# Patient Record
Sex: Female | Born: 1990
Health system: Southern US, Community
[De-identification: ages and names within clinical notes are randomized; demographics above are authoritative.]

## PROBLEM LIST (undated history)

## (undated) DIAGNOSIS — F419 Anxiety disorder, unspecified: Secondary | ICD-10-CM

## (undated) DIAGNOSIS — K219 Gastro-esophageal reflux disease without esophagitis: Secondary | ICD-10-CM

## (undated) HISTORY — PX: EYE SURGERY: SHX253

## (undated) HISTORY — PX: GALLBLADDER SURGERY: SHX652

## (undated) HISTORY — PX: NO PAST SURGERIES: SHX2092

## (undated) HISTORY — DX: Anxiety disorder, unspecified: F41.9

---

## 2014-07-04 ENCOUNTER — Inpatient Hospital Stay (HOSPITAL_COMMUNITY)
Admission: AD | Admit: 2014-07-04 | Discharge: 2014-07-04 | Disposition: A | Discharge status: Home / Self Care | Payer: Self-pay | Attending: Obstetrics and Gynecology | Admitting: Obstetrics and Gynecology

## 2015-06-19 ENCOUNTER — Other Ambulatory Visit: Payer: Self-pay | Admitting: Family Medicine

## 2015-06-19 DIAGNOSIS — N63 Unspecified lump in unspecified breast: Secondary | ICD-10-CM

## 2015-06-20 ENCOUNTER — Ambulatory Visit
Admission: RE | Admit: 2015-06-20 | Discharge: 2015-06-20 | Disposition: A | Discharge status: Home / Self Care | Payer: No Typology Code available for payment source | Source: Ambulatory Visit | Attending: Family Medicine | Admitting: Family Medicine

## 2015-06-20 ENCOUNTER — Ambulatory Visit
Admission: RE | Admit: 2015-06-20 | Discharge: 2015-06-20 | Disposition: A | Discharge status: Home / Self Care | Payer: Self-pay | Source: Ambulatory Visit | Attending: Family Medicine | Admitting: Family Medicine

## 2015-06-20 DIAGNOSIS — N63 Unspecified lump in unspecified breast: Secondary | ICD-10-CM

## 2015-08-02 ENCOUNTER — Encounter (HOSPITAL_COMMUNITY): Payer: Self-pay | Admitting: Emergency Medicine

## 2015-08-02 ENCOUNTER — Ambulatory Visit (HOSPITAL_COMMUNITY)
Admission: EM | Admit: 2015-08-02 | Discharge: 2015-08-02 | Disposition: A | Discharge status: Home / Self Care | Payer: No Typology Code available for payment source | Attending: Emergency Medicine | Admitting: Emergency Medicine

## 2015-08-02 DIAGNOSIS — R531 Weakness: Secondary | ICD-10-CM

## 2015-08-02 DIAGNOSIS — I951 Orthostatic hypotension: Secondary | ICD-10-CM

## 2015-08-02 DIAGNOSIS — R42 Dizziness and giddiness: Secondary | ICD-10-CM

## 2015-08-02 LAB — POCT I-STAT, CHEM 8
BUN: 6 mg/dL (ref 6–20)
CREATININE: 0.5 mg/dL (ref 0.44–1.00)
Calcium, Ion: 1.21 mmol/L (ref 1.12–1.23)
Chloride: 105 mmol/L (ref 101–111)
Glucose, Bld: 90 mg/dL (ref 65–99)
HEMATOCRIT: 44 % (ref 36.0–46.0)
HEMOGLOBIN: 15 g/dL (ref 12.0–15.0)
POTASSIUM: 4 mmol/L (ref 3.5–5.1)
SODIUM: 140 mmol/L (ref 135–145)
TCO2: 25 mmol/L (ref 0–100)

## 2015-08-02 NOTE — Discharge Instructions (Signed)
Your lab work and physical exam is normal. The blood pressure did drop when you stood up. This may represent mild dehydration. Drink plenty of fluids and stay well-hydrated daily.  Dizziness Dizziness is a common problem. It is a feeling of unsteadiness or light-headedness. You may feel like you are about to faint. Dizziness can lead to injury if you stumble or fall. Anyone can become dizzy, but dizziness is more common in older adults. This condition can be caused by a number of things, including medicines, dehydration, or illness. HOME CARE INSTRUCTIONS Taking these steps may help with your condition: Eating and Drinking  Drink enough fluid to keep your urine clear or pale yellow. This helps to keep you from becoming dehydrated. Try to drink more clear fluids, such as water.  Do not drink alcohol.  Limit your caffeine intake if directed by your health care provider.  Limit your salt intake if directed by your health care provider. Activity  Avoid making quick movements.  Rise slowly from chairs and steady yourself until you feel okay.  In the morning, first sit up on the side of the bed. When you feel okay, stand slowly while you hold onto something until you know that your balance is fine.  Move your legs often if you need to stand in one place for a long time. Tighten and relax your muscles in your legs while you are standing.  Do not drive or operate heavy machinery if you feel dizzy.  Avoid bending down if you feel dizzy. Place items in your home so that they are easy for you to reach without leaning over. Lifestyle  Do not use any tobacco products, including cigarettes, chewing tobacco, or electronic cigarettes. If you need help quitting, ask your health care provider.  Try to reduce your stress level, such as with yoga or meditation. Talk with your health care provider if you need help. General Instructions  Watch your dizziness for any changes.  Take medicines only as  directed by your health care provider. Talk with your health care provider if you think that your dizziness is caused by a medicine that you are taking.  Tell a friend or a family member that you are feeling dizzy. If he or she notices any changes in your behavior, have this person call your health care provider.  Keep all follow-up visits as directed by your health care provider. This is important. SEEK MEDICAL CARE IF:  Your dizziness does not go away.  Your dizziness or light-headedness gets worse.  You feel nauseous.  You have reduced hearing.  You have new symptoms.  You are unsteady on your feet or you feel like the room is spinning. SEEK IMMEDIATE MEDICAL CARE IF:  You vomit or have diarrhea and are unable to eat or drink anything.  You have problems talking, walking, swallowing, or using your arms, hands, or legs.  You feel generally weak.  You are not thinking clearly or you have trouble forming sentences. It may take a friend or family member to notice this.  You have chest pain, abdominal pain, shortness of breath, or sweating.  Your vision changes.  You notice any bleeding.  You have a headache.  You have neck pain or a stiff neck.  You have a fever.   This information is not intended to replace advice given to you by your health care provider. Make sure you discuss any questions you have with your health care provider.   Document Released: 09/22/2000 Document  Revised: 08/13/2014 Document Reviewed: 03/25/2014 Elsevier Interactive Patient Education 2016 Elsevier Inc.  Hypotension As your heart beats, it forces blood through your body. This force is called blood pressure. If you have hypotension, you have low blood pressure. When your blood pressure is too low, you may not get enough blood to your brain. You may feel weak, feel lightheaded, have a fast heartbeat, or even pass out (faint). HOME CARE  Drink enough fluids to keep your pee (urine) clear or pale  yellow.  Take all medicines as told by your doctor.  Get up slowly after sitting or lying down.  Wear support stockings as told by your doctor.  Maintain a healthy diet by including foods such as fruits, vegetables, nuts, whole grains, and lean meats. GET HELP IF:  You are throwing up (vomiting) or have watery poop (diarrhea).  You have a fever for more than 2-3 days.  You feel more thirsty than usual.  You feel weak and tired. GET HELP RIGHT AWAY IF:   You pass out (faint).  You have chest pain or a fast or irregular heartbeat.  You lose feeling in part of your body.  You cannot move your arms or legs.  You have trouble speaking.  You get sweaty or feel lightheaded. MAKE SURE YOU:   Understand these instructions.  Will watch your condition.  Will get help right away if you are not doing well or get worse.   This information is not intended to replace advice given to you by your health care provider. Make sure you discuss any questions you have with your health care provider.   Document Released: 06/23/2009 Document Revised: 11/29/2012 Document Reviewed: 09/29/2012 Elsevier Interactive Patient Education 2016 [employer].  Near-Syncope Near-syncope (commonly known as near fainting) is sudden weakness, dizziness, or feeling like you might pass out. During an episode of near-syncope, you may also develop pale skin, have tunnel vision, or feel sick to your stomach (nauseous). Near-syncope may occur when getting up after sitting or while standing for a long time. It is caused by a sudden decrease in blood flow to the brain. This decrease can result from various causes or triggers, most of which are not serious. However, because near-syncope can sometimes be a sign of something serious, a medical evaluation is required. The specific cause is often not determined. HOME CARE INSTRUCTIONS  Monitor your condition for any changes. The following actions may help to alleviate any  discomfort you are experiencing:  Have someone stay with you until you feel stable.  Lie down right away and prop your feet up if you start feeling like you might faint. Breathe deeply and steadily. Wait until all the symptoms have passed. Most of these episodes last only a few minutes. You may feel tired for several hours.   Drink enough fluids to keep your urine clear or pale yellow.   If you are taking blood pressure or heart medicine, get up slowly when seated or lying down. Take several minutes to sit and then stand. This can reduce dizziness.  Follow up with your health care provider as directed. SEEK IMMEDIATE MEDICAL CARE IF:   You have a severe headache.   You have unusual pain in the chest, abdomen, or back.   You are bleeding from the mouth or rectum, or you have black or tarry stool.   You have an irregular or very fast heartbeat.   You have repeated fainting or have seizure-like jerking during an episode.  You faint when sitting or lying down.   You have confusion.   You have difficulty walking.   You have severe weakness.   You have vision problems.  MAKE SURE YOU:   Understand these instructions.  Will watch your condition.  Will get help right away if you are not doing well or get worse.   This information is not intended to replace advice given to you by your health care provider. Make sure you discuss any questions you have with your health care provider.   Document Released: 03/29/2005 Document Revised: 04/03/2013 Document Reviewed: 09/01/2012 Elsevier Interactive Patient Education [employer].

## 2015-08-02 NOTE — ED Provider Notes (Signed)
CSN: [id number]     Arrival date & time 08/02/15  1623 History   First MD Initiated Contact with Patient 08/02/15 1705     Chief Complaint  Patient presents with  . Dizziness   (Consider location/radiation/quality/duration/timing/severity/associated sxs/prior Treatment) HPI Comments: 25 year old female states that this afternoon at approximately 3:30 PM she had a rather sudden onset of dizziness, shakiness and an appearance of pallor as indicated by a fellow employee after she stood up. In addition she had a feeling of tingling in the left upper arm. She states this abnormal feeling lasted approximately 20 minutes. She states she feels much better now ,although she occasionally has dizziness when changing positions especially when arising from a seated position.denies syncope. She did have a mild posterior headache.   History reviewed. No pertinent past medical history. History reviewed. No pertinent past surgical history. No family history on file. Social History  Substance Use Topics  . Smoking status: Never Smoker   . Smokeless tobacco: None  . Alcohol Use: No   OB History    No data available     Review of Systems  Constitutional: Negative for fever and chills.  HENT: Negative for congestion, hearing loss, postnasal drip, rhinorrhea, sore throat, tinnitus and trouble swallowing.   Eyes: Negative.  Negative for visual disturbance.  Respiratory: Negative for cough, choking, chest tightness and shortness of breath.   Cardiovascular: Negative for chest pain and leg swelling.  Gastrointestinal: Negative.   Genitourinary: Negative for dysuria, urgency, frequency, hematuria, flank pain and pelvic pain.       Small amt clear vaginal discharge.  Musculoskeletal: Negative.   Skin: Negative.   Neurological: Positive for dizziness, tremors, numbness and headaches. Negative for seizures, syncope, facial asymmetry and speech difficulty.  Psychiatric/Behavioral: Negative.   All other  systems reviewed and are negative.   Allergies  Review of patient's allergies indicates no known allergies.  Home Medications   Prior to Admission medications   Not on File   Meds Ordered and Administered this Visit  Medications - No data to display  BP 116/70 mmHg  Pulse 63  Temp(Src) 98 F (36.7 C) (Oral)  Resp 18  SpO2 100%  LMP 07/06/2015 Orthostatic VS for the past 24 hrs:  BP- Lying Pulse- Lying BP- Sitting Pulse- Sitting BP- Standing at 0 minutes Pulse- Standing at 0 minutes  08/02/15 1812 113/76 mmHg 72 119/79 mmHg 69 105/64 mmHg 72    Physical Exam  Constitutional: She is oriented to person, place, and time. She appears well-developed and well-nourished. No distress.  HENT:  Head: Normocephalic and atraumatic.  Nose: Nose normal.  Mouth/Throat: Oropharynx is clear and moist. No oropharyngeal exudate.  Eyes: Conjunctivae and EOM are normal. Pupils are equal, round, and reactive to light.  Neck: Normal range of motion. Neck supple.  Cardiovascular: Normal rate, regular rhythm and normal heart sounds.   Pulmonary/Chest: Effort normal and breath sounds normal. No respiratory distress. She has no wheezes. She has no rales.  Abdominal: Soft. There is no tenderness.  Musculoskeletal: Normal range of motion. She exhibits no edema or tenderness.  Lymphadenopathy:    She has no cervical adenopathy.  Neurological: She is alert and oriented to person, place, and time. She has normal strength. No cranial nerve deficit or sensory deficit. She exhibits normal muscle tone. Coordination normal.  Speech lucid. No dysarthria. Fully awake, alert, oriented. No asymmetry.  Skin: Skin is warm and dry.  Psychiatric: She has a normal mood and affect.  Nursing note  and vitals reviewed.   ED Course  Procedures (including critical care time)  Labs Review Labs Reviewed  POCT I-STAT, CHEM 8   Results for orders placed or performed during the hospital encounter of 08/02/15  I-STAT,  chem 8  Result Value Ref Range   Sodium 140 135 - 145 mmol/L   Potassium 4.0 3.5 - 5.1 mmol/L   Chloride 105 101 - 111 mmol/L   BUN 6 6 - 20 mg/dL   Creatinine, Ser 0.50 0.44 - 1.00 mg/dL   Glucose, Bld 90 65 - 99 mg/dL   Calcium, Ion 1.21 1.12 - 1.23 mmol/L   TCO2 25 0 - 100 mmol/L   Hemoglobin 15.0 12.0 - 15.0 g/dL   HCT 44.0 36.0 - 46.0 %     Imaging Review No results found.   Visual Acuity Review  Right Eye Distance:   Left Eye Distance:   Bilateral Distance:    Right Eye Near:   Left Eye Near:    Bilateral Near:         MDM   1. Dizziness   2. Weakness   3. Orthostasis    Your lab work and physical exam is normal. The blood pressure did drop when you stood up. This may represent mild dehydration. Drink plenty of fluids and stay well-hydrated daily.     Janne Napoleon, NP 08/02/15 [phone]

## 2015-08-02 NOTE — ED Notes (Signed)
Pt c/o feeling dizzy onset 1530 today associated w/diaphoresis, left arm numbness, neck pain, HA, bitter taste and feeling weakness A&O x4... No acute distress.... Family hx of DM and HTN

## 2016-04-20 ENCOUNTER — Ambulatory Visit (INDEPENDENT_AMBULATORY_CARE_PROVIDER_SITE_OTHER): Payer: Self-pay | Admitting: Physician Assistant

## 2016-04-20 VITALS — BP 108/80 | HR 80 | Temp 98.4°F | Resp 16 | Ht 61.0 in | Wt 173.0 lb

## 2016-04-20 DIAGNOSIS — N898 Other specified noninflammatory disorders of vagina: Secondary | ICD-10-CM

## 2016-04-20 LAB — POCT WET + KOH PREP
Trich by wet prep: ABSENT
YEAST BY KOH: ABSENT
Yeast by wet prep: ABSENT

## 2016-04-20 LAB — POCT URINALYSIS DIP (MANUAL ENTRY)
Bilirubin, UA: NEGATIVE
GLUCOSE UA: NEGATIVE
Ketones, POC UA: NEGATIVE
LEUKOCYTES UA: NEGATIVE
NITRITE UA: NEGATIVE
PROTEIN UA: NEGATIVE
Spec Grav, UA: 1.02
UROBILINOGEN UA: 0.2
pH, UA: 7

## 2016-04-20 LAB — POC MICROSCOPIC URINALYSIS (UMFC): Mucus: ABSENT

## 2016-04-20 LAB — POCT URINE PREGNANCY: PREG TEST UR: NEGATIVE

## 2016-04-20 MED ORDER — NYSTATIN 100000 UNIT/GM EX CREA: 1.0000 "application " | TOPICAL_CREAM | Freq: Two times a day (BID) | CUTANEOUS | 0 refills | Status: DC

## 2016-04-20 NOTE — Patient Instructions (Signed)
     IF you received an x-ray today, you will receive an invoice from [facility]. Please contact [facility] at [phone] with questions or concerns regarding your invoice.   IF you received labwork today, you will receive an invoice from [facility]. Please contact [facility] at [phone] with questions or concerns regarding your invoice.   Our billing staff will not be able to assist you with questions regarding bills from these companies.  You will be contacted with the lab results as soon as they are available. The fastest way to get your results is to activate your My Chart account. Instructions are located on the last page of this paperwork. If you have not heard from us regarding the results in 2 weeks, please contact this office.     

## 2016-04-20 NOTE — Progress Notes (Signed)
[facility] [address] [place] [address] [phone]  Date:  04/20/2016   Name:  Tara Mathews   DOB:  Mar 01, 1991   MRN:  [id number]  PCP:  No PCP Per Patient    History of Present Illness:  Tara Mathews is a 26 y.o. female patient who presents to [facility] for cc of vaginal discharge.   2 weeks of dysuria.  There is redness at the vaginal area.  Discharge is white and cottage cheese like.   Back pain at the lower side which feels like pressure.  Does not radiate anywhere.  No fever.  No nausea.   Sexually active unprotected in December.  She had std testing which was negative.  Since then, no unprotected sex.  Patient's last menstrual period was 04/05/2016 (exact date).   There are no active problems to display for this patient.   No past medical history on file.  No past surgical history on file.  Social History  Substance Use Topics  . Smoking status: Never Smoker  . Smokeless tobacco: Never Used  . Alcohol use No    No family history on file.  No Known Allergies  Medication list has been reviewed and updated.  No current outpatient prescriptions on file prior to visit.   No current facility-administered medications on file prior to visit.     ROS ROS otherwise unremarkable unless listed above.   Physical Examination: BP 108/80 (BP Location: Right Arm, Patient Position: Sitting, Cuff Size: Normal)   Pulse 80   Temp 98.4 F (36.9 C) (Oral)   Resp 16   Ht 5\' 1"  (1.549 m)   Wt 173 lb (78.5 kg)   LMP 04/05/2016 (Exact Date)   SpO2 100%   BMI 32.69 kg/m  Ideal Body Weight: Weight in (lb) to have BMI = 25: 132  Physical Exam  Constitutional: She is oriented to person, place, and time. She appears well-developed and well-nourished. No distress.  HENT:  Head: Normocephalic and atraumatic.  Right Ear: External ear normal.  Left Ear: External ear normal.  Eyes: Conjunctivae and EOM are normal. Pupils are equal,  round, and reactive to light.  Cardiovascular: Normal rate.   Pulmonary/Chest: Effort normal. No respiratory distress.  Abdominal: Soft. Normal appearance. There is tenderness in the suprapubic area. There is no CVA tenderness.  Genitourinary: Pelvic exam was performed with patient supine. There is rash (mild erythema) on the right labia. There is rash (mild erythema) on the left labia. Cervix exhibits discharge (white). Cervix exhibits no motion tenderness and no friability.  There is a foreign body in the vagina. Vaginal discharge found.  Musculoskeletal:  Right sided sacral tenderness  Neurological: She is alert and oriented to person, place, and time.  Skin: She is not diaphoretic.  Psychiatric: She has a normal mood and affect. Her behavior is normal.    Results for orders placed or performed in visit on 04/20/16  POCT Microscopic Urinalysis ([facility])  Result Value Ref Range   WBC,UR,HPF,POC None None WBC/hpf   RBC,UR,HPF,POC None None RBC/hpf   Bacteria None None, Too numerous to count   Mucus Absent Absent   Epithelial Cells, UR Per Microscopy Moderate (A) None, Too numerous to count cells/hpf  POCT urinalysis dipstick  Result Value Ref Range   Color, UA yellow yellow   Clarity, UA clear clear   Glucose, UA negative negative   Bilirubin, UA negative negative   Ketones, POC UA negative negative   Spec  Grav, UA 1.020    Blood, UA small (A) negative   pH, UA 7.0    Protein Ur, POC negative negative   Urobilinogen, UA 0.2    Nitrite, UA Negative Negative   Leukocytes, UA Negative Negative    Assessment and Plan: Tara Mathews is a 26 y.o. female who is here today for cc of vaginal discharge.   --will return a gonorrhea chlamydia from morning urination.   Vaginal discharge - Plan: POCT Microscopic Urinalysis ([facility]), POCT urinalysis dipstick, POCT urine pregnancy, POCT Wet + KOH Prep  Ivar Drape, PA-C [facility] 1/9/20182:54 PM

## 2016-05-06 ENCOUNTER — Telehealth: Payer: Self-pay

## 2016-05-06 NOTE — Telephone Encounter (Signed)
Pt states that she called earlier last week but nothing in system about her meds are making her sick and she is looking for her lab results   Best number [phone]

## 2016-05-13 MED ORDER — ZINC OXIDE 11.3 % EX CREA: 1.0000 "application " | TOPICAL_CREAM | Freq: Two times a day (BID) | CUTANEOUS | 0 refills | Status: DC

## 2016-05-13 NOTE — Telephone Encounter (Signed)
PT WAS CALLING ABOUT LABS FROM JAN 6TH PLUS SHE HAD USED THE CREAM AND IT MAKES HER BURN SHE HAS CALLED SEVERAL TIMES PT STATES AND KNOWONE CALLED HER BACK PT STATES THAT SHE BROUGHT BACK URINE FOR GC/CHLAMYDIA TEST THE NEXT DAY AND GAVE IT TO SOMEONE UP FRONT I DON'T SEE RESULTS FOR THAT TEST OR IF IT WAS EVEN DONE

## 2016-05-13 NOTE — Telephone Encounter (Signed)
Pt wants her lab results from 04/20/16 I gave neg preg and neg urine but see wet prep abnormal and gc/chlamydia not run?  Used cream rx but has had no relief. Please advise today, she states she has been waiting and needs advice [phone]

## 2016-05-13 NOTE — Telephone Encounter (Signed)
Pt advised and will come in for repeat urine fast track lab Also stephanie will call in new cream for pt.

## 2016-05-13 NOTE — Telephone Encounter (Signed)
There was a future lab for gonorrhea/chlamydia.  Please apologize, but the lab never sent it over [facility], if she brought the specimen back.  She can come back and get a new cup.  Or wait more than 3 hours, and urinate here.  And I will get the lab back shortly

## 2016-05-14 NOTE — Addendum Note (Signed)
Addended by: Burnis Kingfisher on: 05/14/2016 11:03 AM   Modules accepted: Orders

## 2016-05-18 ENCOUNTER — Telehealth: Payer: Self-pay

## 2016-05-18 LAB — GC/CHLAMYDIA PROBE AMP
CHLAMYDIA, DNA PROBE: NEGATIVE
NEISSERIA GONORRHOEAE BY PCR: NEGATIVE

## 2016-05-18 NOTE — Telephone Encounter (Signed)
Thank you very much 

## 2016-05-18 NOTE — Telephone Encounter (Signed)
Urine test for std negative from 05/14/16 L/m with" neg result" Come see [place] if further problems (see prior note)

## 2016-06-24 ENCOUNTER — Ambulatory Visit (INDEPENDENT_AMBULATORY_CARE_PROVIDER_SITE_OTHER): Payer: Self-pay | Admitting: Emergency Medicine

## 2016-06-24 VITALS — BP 118/70 | HR 90 | Temp 99.0°F | Resp 16 | Ht 61.0 in | Wt 176.0 lb

## 2016-06-24 DIAGNOSIS — N926 Irregular menstruation, unspecified: Secondary | ICD-10-CM

## 2016-06-24 LAB — POCT URINE PREGNANCY: Preg Test, Ur: NEGATIVE

## 2016-06-24 NOTE — Patient Instructions (Signed)
     IF you received an x-ray today, you will receive an invoice from [facility]. Please contact [facility] at [phone] with questions or concerns regarding your invoice.   IF you received labwork today, you will receive an invoice from [facility]. Please contact [facility] at [phone] with questions or concerns regarding your invoice.   Our billing staff will not be able to assist you with questions regarding bills from these companies.  You will be contacted with the lab results as soon as they are available. The fastest way to get your results is to activate your My Chart account. Instructions are located on the last page of this paperwork. If you have not heard from us regarding the results in 2 weeks, please contact this office.     

## 2016-06-24 NOTE — Progress Notes (Signed)
Tara Mathews 26 y.o.   Chief Complaint  Patient presents with  . Pregnancy Test    HISTORY OF PRESENT ILLNESS: This is a 26 y.o. female missed period and thinks she might pregnant. States she had both positive and negative results at home.  HPI   Prior to Admission medications   Not on File    Allergies  Allergen Reactions  . Bactrim [Sulfamethoxazole-Trimethoprim]     There are no active problems to display for this patient.   No past medical history on file.  No past surgical history on file.  Social History   Social History  . Marital status: Single    Spouse name: N/A  . Number of children: N/A  . Years of education: N/A   Occupational History  . Not on file.   Social History Main Topics  . Smoking status: Never Smoker  . Smokeless tobacco: Never Used  . Alcohol use No  . Drug use: No  . Sexual activity: No   Other Topics Concern  . Not on file   Social History Narrative  . No narrative on file    No family history on file.   Review of Systems  Constitutional: Negative for chills and fever.  HENT: Negative.   Eyes: Negative.   Respiratory: Negative.  Negative for cough and shortness of breath.   Cardiovascular: Negative.  Negative for chest pain, palpitations and leg swelling.  Gastrointestinal: Negative for abdominal pain, diarrhea, nausea and vomiting.  Genitourinary: Negative.  Negative for dysuria and hematuria.  Skin: Negative for rash.  Neurological: Negative for dizziness and headaches.  All other systems reviewed and are negative.  Vitals:   06/24/16 1341  BP: 118/70  Pulse: 90  Resp: 16  Temp: 99 F (37.2 C)     Physical Exam  Constitutional: She is oriented to person, place, and time. She appears well-developed and well-nourished.  HENT:  Head: Normocephalic and atraumatic.  Nose: Nose normal.  Mouth/Throat: Oropharynx is clear and moist.  Eyes: Conjunctivae and EOM are normal. Pupils are equal, round, and  reactive to light.  Neck: Normal range of motion. Neck supple. No JVD present.  Cardiovascular: Normal rate, regular rhythm and normal heart sounds.   Pulmonary/Chest: Effort normal and breath sounds normal.  Abdominal: Soft. Bowel sounds are normal. She exhibits no distension. There is no tenderness.  Musculoskeletal: Normal range of motion.  Lymphadenopathy:    She has no cervical adenopathy.  Neurological: She is alert and oriented to person, place, and time. No sensory deficit. She exhibits normal muscle tone.  Skin: Skin is warm and dry. Capillary refill takes less than 2 seconds.  Psychiatric: She has a normal mood and affect. Her behavior is normal.  Vitals reviewed.    ASSESSMENT & PLAN: Tara Mathews was seen today for pregnancy test.  Diagnoses and all orders for this visit:  Missed period -     POCT urine pregnancy -     Beta HCG, Quant      Tara Caroli, MD [facility]

## 2016-06-25 LAB — BETA HCG QUANT (REF LAB): hCG Quant: 1 m[IU]/mL

## 2017-03-02 ENCOUNTER — Other Ambulatory Visit: Payer: Self-pay | Admitting: Medical

## 2017-03-02 DIAGNOSIS — N63 Unspecified lump in unspecified breast: Secondary | ICD-10-CM

## 2017-03-08 ENCOUNTER — Other Ambulatory Visit (HOSPITAL_COMMUNITY): Payer: Self-pay | Admitting: *Deleted

## 2017-03-08 DIAGNOSIS — N63 Unspecified lump in unspecified breast: Secondary | ICD-10-CM

## 2017-03-21 ENCOUNTER — Telehealth (HOSPITAL_COMMUNITY): Payer: Self-pay | Admitting: *Deleted

## 2017-03-21 NOTE — Telephone Encounter (Signed)
Called patient to let [facility] know [facility] will be on a delay tomorrow due to the inclement weather. Rescheduled patients appointment to 1030. Patient stated that appointment time works and rescheduled appointment.

## 2017-03-22 ENCOUNTER — Encounter (HOSPITAL_COMMUNITY): Payer: Self-pay

## 2017-03-22 ENCOUNTER — Ambulatory Visit (HOSPITAL_COMMUNITY): Payer: Self-pay

## 2017-03-22 ENCOUNTER — Ambulatory Visit
Admission: RE | Admit: 2017-03-22 | Discharge: 2017-03-22 | Disposition: A | Discharge status: Home / Self Care | Payer: No Typology Code available for payment source | Source: Ambulatory Visit | Attending: Obstetrics and Gynecology | Admitting: Obstetrics and Gynecology

## 2017-03-22 ENCOUNTER — Ambulatory Visit (HOSPITAL_COMMUNITY)
Admission: RE | Admit: 2017-03-22 | Discharge: 2017-03-22 | Disposition: A | Discharge status: Home / Self Care | Payer: Self-pay | Source: Ambulatory Visit | Attending: Obstetrics and Gynecology | Admitting: Obstetrics and Gynecology

## 2017-03-22 ENCOUNTER — Other Ambulatory Visit: Payer: Self-pay

## 2017-03-22 VITALS — BP 116/84 | Temp 98.4°F | Ht 59.0 in | Wt 184.4 lb

## 2017-03-22 DIAGNOSIS — N63 Unspecified lump in unspecified breast: Secondary | ICD-10-CM

## 2017-03-22 DIAGNOSIS — N644 Mastodynia: Secondary | ICD-10-CM

## 2017-03-22 DIAGNOSIS — Z1239 Encounter for other screening for malignant neoplasm of breast: Secondary | ICD-10-CM

## 2017-03-22 NOTE — Progress Notes (Signed)
Complaints of bilateral breast lumps since March 2017 and increased left breast pain x 5 months that comes and goes. Patient rated the pain at a 7 out of 10.  Pap Smear: Pap smear not completed today. Last Pap smear was 2 weeks ago at [facility] and normal per patient. Patient stated that her recent Pap smear was her first Pap smear. No Pap smear results are in Epic.  Physical exam: Breasts Breasts symmetrical. No skin abnormalities bilateral breasts. No nipple retraction bilateral breasts. No nipple discharge bilateral breasts. No lymphadenopathy. No lumps palpated bilateral breasts. Unable to palpate any lumps in patients area of concern. Complaints of left upper and inner quadrant breast pain on exam. Referred patient to the [facility] for bilateral breast ultrasounds. Appointment scheduled for Tuesday, March 22, 2017 at 1130.        Pelvic/Bimanual No Pap smear completed today since last Pap smear was 2 weeks ago. Pap smear not indicated per BCCCP guidelines.   Smoking History: Patient has never smoked.  Patient Navigation: Patient education provided. Access to services provided for patient through [facility] program.

## 2017-03-22 NOTE — Patient Instructions (Signed)
Explained breast self awareness with [name]. Patient did not need a Pap smear today due to last Pap smear was 2 weeks ago per patient. Let her know [facility] will cover Pap smears every 3 years unless has a history of abnormal Pap smears. Referred patient to the [facility] for bilateral breast ultrasounds. Appointment scheduled for Tuesday, March 22, 2017 at 1130. [name] verbalized understanding.  Alvon Nygaard, Arvil Chaco, RN 10:52 AM

## 2017-03-30 ENCOUNTER — Encounter (HOSPITAL_COMMUNITY): Payer: Self-pay | Admitting: *Deleted

## 2018-10-17 DIAGNOSIS — Z3490 Encounter for supervision of normal pregnancy, unspecified, unspecified trimester: Secondary | ICD-10-CM | POA: Insufficient documentation

## 2018-10-18 ENCOUNTER — Other Ambulatory Visit: Payer: Self-pay

## 2018-10-18 ENCOUNTER — Ambulatory Visit (INDEPENDENT_AMBULATORY_CARE_PROVIDER_SITE_OTHER): Payer: Self-pay | Admitting: Obstetrics & Gynecology

## 2018-10-18 ENCOUNTER — Encounter: Payer: Self-pay | Admitting: Obstetrics & Gynecology

## 2018-10-18 VITALS — BP 113/78 | HR 102 | Temp 97.4°F | Wt 187.5 lb

## 2018-10-18 DIAGNOSIS — Z1151 Encounter for screening for human papillomavirus (HPV): Secondary | ICD-10-CM

## 2018-10-18 DIAGNOSIS — Z3490 Encounter for supervision of normal pregnancy, unspecified, unspecified trimester: Secondary | ICD-10-CM

## 2018-10-18 DIAGNOSIS — O99210 Obesity complicating pregnancy, unspecified trimester: Secondary | ICD-10-CM

## 2018-10-18 DIAGNOSIS — N898 Other specified noninflammatory disorders of vagina: Secondary | ICD-10-CM

## 2018-10-18 DIAGNOSIS — Z3A11 11 weeks gestation of pregnancy: Secondary | ICD-10-CM

## 2018-10-18 DIAGNOSIS — Z113 Encounter for screening for infections with a predominantly sexual mode of transmission: Secondary | ICD-10-CM

## 2018-10-18 DIAGNOSIS — O99211 Obesity complicating pregnancy, first trimester: Secondary | ICD-10-CM

## 2018-10-18 DIAGNOSIS — Z124 Encounter for screening for malignant neoplasm of cervix: Secondary | ICD-10-CM

## 2018-10-18 MED ORDER — BLOOD PRESSURE MONITOR KIT: 1.0000 | PACK | 0 refills | Status: DC

## 2018-10-18 MED ORDER — OMEPRAZOLE 20 MG PO CPDR: 20.0000 mg | DELAYED_RELEASE_CAPSULE | Freq: Every day | ORAL | 5 refills | Status: DC

## 2018-10-18 MED ORDER — ASPIRIN EC 81 MG PO TBEC: 81.0000 mg | DELAYED_RELEASE_TABLET | Freq: Every day | ORAL | 2 refills | Status: DC

## 2018-10-18 NOTE — Progress Notes (Addendum)
NOB.  G1.  C/o NV x 6 weeks, acid reflux, carpal tunnel in hand and arm.

## 2018-10-18 NOTE — Progress Notes (Signed)
Subjective:    Tara Mathews is a G37P0 [redacted]w[redacted]d being seen today for her first obstetrical visit.  Her obstetrical history is significant for obesity. Patient does intend to breast feed. Pregnancy history fully reviewed.  Patient reports heartburn, nausea and recent spotting.  Vitals:   10/18/18 1439  BP: 113/78  Pulse: (!) 102  Temp: (!) 97.4 F (36.3 C)  Weight: 187 lb 8 oz (85 kg)    HISTORY: OB History  Gravida Para Term Preterm AB Living  1            SAB TAB Ectopic Multiple Live Births               # Outcome Date GA Lbr Len/2nd Weight Sex Delivery Anes PTL Lv  1 Current            Past Medical History:  Diagnosis Date  . Anxiety    History reviewed. No pertinent surgical history. Family History  Problem Relation Age of Onset  . Cancer Maternal Aunt   . Miscarriages / Stillbirths Maternal Aunt   . Cancer Maternal Uncle   . Diabetes Paternal Grandmother   . Obesity Mother   . Arthritis Father   . Hypertension Father   . Obesity Father      Exam    Uterus:     Pelvic Exam:    Perineum: No Hemorrhoids   Vulva: normal   Vagina:  normal mucosa   pH:     Cervix: no lesions   Adnexa: normal adnexa   Bony Pelvis: average  System: Breast:  normal appearance, no masses or tenderness   Skin: normal coloration and turgor, no rashes    Neurologic: oriented, normal mood   Extremities: normal strength, tone, and muscle mass   HEENT neck supple with midline trachea   Mouth/Teeth mucous membranes moist, pharynx normal without lesions   Neck supple   Cardiovascular: regular rate and rhythm, no murmurs or gallops   Respiratory:  appears well, vitals normal, no respiratory distress, acyanotic, normal RR, neck free of mass or lymphadenopathy, chest clear, no wheezing, crepitations, rhonchi, normal symmetric air entry   Abdomen: soft, non-tender; bowel sounds normal; no masses,  no organomegaly   Urinary: urethral meatus normal      Assessment:    Pregnancy: G1P0 Patient Active Problem List   Diagnosis Date Noted  . Encounter for supervision of normal pregnancy 10/17/2018  . Missed period 06/24/2016        Plan:     Initial labs drawn. Prenatal vitamins. Problem list reviewed and updated. Genetic Screening discussed Panorama ordered  Ultrasound discussed; fetal survey: ordered.  Follow up in 8 weeks. 50% of 30 min visit spent on counseling and coordination of care.  Babyscripts app  Recommendations [x]  Aspirin 81 mg daily after 12 weeks; discontinue after 36 weeks [ ]  Nutrition consult [ ]  Weight gain 11-20 lbs for singleton and 25-35 lbs for twin pregnancy (IOM guidelines) . Higher class of obesity patients recommended to gain closer to lower limit  . Weight loss is associated with adverse outcomes [ ]  Baseline and surveillance labs (pulled in from [facility], refresh links as needed)  Lab Results  Component Value Date   CREATININE 0.50 08/02/2015    Antenatal Testing: Not indicated.  [ ]  Growth scans every 4-6 weeks as needed (fundal height likely inadequate in morbidly obese patients)  Postpartum Care: [ ]  Consider prophylactic wound vac/PICO for C/S [ ]  Lovenox for DVT/PE prophylaxis (6 hours after  vaginal delivery, 12 hours after C/S).    Lovenox 40 mg [place] q24h (BMI 30.0-39.9 kg/m2)   Lovenox 0.5 mg/kg [place] q12h ((BMI ?40 kg/m2 ); Max 150 mg [place] q12h.   Consider prolonged therapy x 6 weeks PP in very concerning patients (I.e morbid obesity with other co-morbidities that increase risk of DVT/PE) [ ]  Counsel about diet, exercise and weight loss. Referrals PRN.  ICD10 Codes: O99.210   Obesity in pregnancy (BMI 30.0-39.9 kg/m2)  O99.210, E66.01 Maternal Morbid Obesity (BMI ?40 kg/m2 ).**Have to use both codes, this is a [place] code and risk adjusts/more reimbursement**  Obesity is defined as body mass index (BMI) ?30 kg/m2 .  Marland Kitchen Class I (BMI 30.0 to 34.9 kg/m2) . Class II (BMI 35.0 to 39.9 kg/m2) . Class III/Morbid  obesity (BMI ?40 kg/m2 ).    Emeterio Reeve 10/18/2018

## 2018-10-18 NOTE — Patient Instructions (Signed)
First Trimester of Pregnancy The first trimester of pregnancy is from week 1 until the end of week 13 (months 1 through 3). A week after a sperm fertilizes an egg, the egg will implant on the wall of the uterus. This embryo will begin to develop into a baby. Genes from you and your partner will form the baby. The female genes will determine whether the baby will be a boy or a girl. At 6-8 weeks, the eyes and face will be formed, and the heartbeat can be seen on ultrasound. At the end of 12 weeks, all the baby's organs will be formed. Now that you are pregnant, you will want to do everything you can to have a healthy baby. Two of the most important things are to get good prenatal care and to follow your health care provider's instructions. Prenatal care is all the medical care you receive before the baby's birth. This care will help prevent, find, and treat any problems during the pregnancy and childbirth. Body changes during your first trimester Your body goes through many changes during pregnancy. The changes vary from woman to woman.  You may gain or lose a couple of pounds at first.  You may feel sick to your stomach (nauseous) and you may throw up (vomit). If the vomiting is uncontrollable, call your health care provider.  You may tire easily.  You may develop headaches that can be relieved by medicines. All medicines should be approved by your health care provider.  You may urinate more often. Painful urination may mean you have a bladder infection.  You may develop heartburn as a result of your pregnancy.  You may develop constipation because certain hormones are causing the muscles that push stool through your intestines to slow down.  You may develop hemorrhoids or swollen veins (varicose veins).  Your breasts may begin to grow larger and become tender. Your nipples may stick out more, and the tissue that surrounds them (areola) may become darker.  Your gums may bleed and may be  sensitive to brushing and flossing.  Dark spots or blotches (chloasma, mask of pregnancy) may develop on your face. This will likely fade after the baby is born.  Your menstrual periods will stop.  You may have a loss of appetite.  You may develop cravings for certain kinds of food.  You may have changes in your emotions from day to day, such as being excited to be pregnant or being concerned that something may go wrong with the pregnancy and baby.  You may have more vivid and strange dreams.  You may have changes in your hair. These can include thickening of your hair, rapid growth, and changes in texture. Some women also have hair loss during or after pregnancy, or hair that feels dry or thin. Your hair will most likely return to normal after your baby is born. What to expect at prenatal visits During a routine prenatal visit:  You will be weighed to make sure you and the baby are growing normally.  Your blood pressure will be taken.  Your abdomen will be measured to track your baby's growth.  The fetal heartbeat will be listened to between weeks 10 and 14 of your pregnancy.  Test results from any previous visits will be discussed. Your health care provider may ask you:  How you are feeling.  If you are feeling the baby move.  If you have had any abnormal symptoms, such as leaking fluid, bleeding, severe headaches, or abdominal   cramping.  If you are using any tobacco products, including cigarettes, chewing tobacco, and electronic cigarettes.  If you have any questions. Other tests that may be performed during your first trimester include:  Blood tests to find your blood type and to check for the presence of any previous infections. The tests will also be used to check for low iron levels (anemia) and protein on red blood cells (Rh antibodies). Depending on your risk factors, or if you previously had diabetes during pregnancy, you may have tests to check for high blood sugar  that affects pregnant women (gestational diabetes).  Urine tests to check for infections, diabetes, or protein in the urine.  An ultrasound to confirm the proper growth and development of the baby.  Fetal screens for spinal cord problems (spina bifida) and Down syndrome.  HIV (human immunodeficiency virus) testing. Routine prenatal testing includes screening for HIV, unless you choose not to have this test.  You may need other tests to make sure you and the baby are doing well. Follow these instructions at home: Medicines  Follow your health care provider's instructions regarding medicine use. Specific medicines may be either safe or unsafe to take during pregnancy.  Take a prenatal vitamin that contains at least 600 micrograms (mcg) of folic acid.  If you develop constipation, try taking a stool softener if your health care provider approves. Eating and drinking   Eat a balanced diet that includes fresh fruits and vegetables, whole grains, good sources of protein such as meat, eggs, or tofu, and low-fat dairy. Your health care provider will help you determine the amount of weight gain that is right for you.  Avoid raw meat and uncooked cheese. These carry germs that can cause birth defects in the baby.  Eating four or five small meals rather than three large meals a day may help relieve nausea and vomiting. If you start to feel nauseous, eating a few soda crackers can be helpful. Drinking liquids between meals, instead of during meals, also seems to help ease nausea and vomiting.  Limit foods that are high in fat and processed sugars, such as fried and sweet foods.  To prevent constipation: ? Eat foods that are high in fiber, such as fresh fruits and vegetables, whole grains, and beans. ? Drink enough fluid to keep your urine clear or pale yellow. Activity  Exercise only as directed by your health care provider. Most women can continue their usual exercise routine during  pregnancy. Try to exercise for 30 minutes at least 5 days a week. Exercising will help you: ? Control your weight. ? Stay in shape. ? Be prepared for labor and delivery.  Experiencing pain or cramping in the lower abdomen or lower back is a good sign that you should stop exercising. Check with your health care provider before continuing with normal exercises.  Try to avoid standing for long periods of time. Move your legs often if you must stand in one place for a long time.  Avoid heavy lifting.  Wear low-heeled shoes and practice good posture.  You may continue to have sex unless your health care provider tells you not to. Relieving pain and discomfort  Wear a good support bra to relieve breast tenderness.  Take warm sitz baths to soothe any pain or discomfort caused by hemorrhoids. Use hemorrhoid cream if your health care provider approves.  Rest with your legs elevated if you have leg cramps or low back pain.  If you develop varicose veins in   your legs, wear support hose. Elevate your feet for 15 minutes, 3-4 times a day. Limit salt in your diet. Prenatal care  Schedule your prenatal visits by the twelfth week of pregnancy. They are usually scheduled monthly at first, then more often in the last 2 months before delivery.  Write down your questions. Take them to your prenatal visits.  Keep all your prenatal visits as told by your health care provider. This is important. Safety  Wear your seat belt at all times when driving.  Make a list of emergency phone numbers, including numbers for family, friends, the hospital, and police and fire departments. General instructions  Ask your health care provider for a referral to a local prenatal education class. Begin classes no later than the beginning of month 6 of your pregnancy.  Ask for help if you have counseling or nutritional needs during pregnancy. Your health care provider can offer advice or refer you to specialists for help  with various needs.  Do not use hot tubs, steam rooms, or saunas.  Do not douche or use tampons or scented sanitary pads.  Do not cross your legs for long periods of time.  Avoid cat litter boxes and soil used by cats. These carry germs that can cause birth defects in the baby and possibly loss of the fetus by miscarriage or stillbirth.  Avoid all smoking, herbs, alcohol, and medicines not prescribed by your health care provider. Chemicals in these products affect the formation and growth of the baby.  Do not use any products that contain nicotine or tobacco, such as cigarettes and e-cigarettes. If you need help quitting, ask your health care provider. You may receive counseling support and other resources to help you quit.  Schedule a dentist appointment. At home, brush your teeth with a soft toothbrush and be gentle when you floss. Contact a health care provider if:  You have dizziness.  You have mild pelvic cramps, pelvic pressure, or nagging pain in the abdominal area.  You have persistent nausea, vomiting, or diarrhea.  You have a bad smelling vaginal discharge.  You have pain when you urinate.  You notice increased swelling in your face, hands, legs, or ankles.  You are exposed to fifth disease or chickenpox.  You are exposed to German measles (rubella) and have never had it. Get help right away if:  You have a fever.  You are leaking fluid from your vagina.  You have spotting or bleeding from your vagina.  You have severe abdominal cramping or pain.  You have rapid weight gain or loss.  You vomit blood or material that looks like coffee grounds.  You develop a severe headache.  You have shortness of breath.  You have any kind of trauma, such as from a fall or a car accident. Summary  The first trimester of pregnancy is from week 1 until the end of week 13 (months 1 through 3).  Your body goes through many changes during pregnancy. The changes vary from  woman to woman.  You will have routine prenatal visits. During those visits, your health care provider will examine you, discuss any test results you may have, and talk with you about how you are feeling. This information is not intended to replace advice given to you by your health care provider. Make sure you discuss any questions you have with your health care provider. Document Released: 03/23/2001 Document Revised: 03/11/2017 Document Reviewed: 03/10/2016 Elsevier Patient Education  2020 [employer]  

## 2018-10-19 LAB — OBSTETRIC PANEL, INCLUDING HIV
Antibody Screen: NEGATIVE
Basophils Absolute: 0 10*3/uL (ref 0.0–0.2)
Basos: 1 %
EOS (ABSOLUTE): 0.1 10*3/uL (ref 0.0–0.4)
Eos: 1 %
HIV Screen 4th Generation wRfx: NONREACTIVE
Hematocrit: 39.9 % (ref 34.0–46.6)
Hemoglobin: 13.6 g/dL (ref 11.1–15.9)
Hepatitis B Surface Ag: NEGATIVE
Immature Grans (Abs): 0 10*3/uL (ref 0.0–0.1)
Immature Granulocytes: 0 %
Lymphocytes Absolute: 2 10*3/uL (ref 0.7–3.1)
Lymphs: 23 %
MCH: 29 pg (ref 26.6–33.0)
MCHC: 34.1 g/dL (ref 31.5–35.7)
MCV: 85 fL (ref 79–97)
Monocytes Absolute: 0.5 10*3/uL (ref 0.1–0.9)
Monocytes: 6 %
Neutrophils Absolute: 6.1 10*3/uL (ref 1.4–7.0)
Neutrophils: 69 %
Platelets: 326 10*3/uL (ref 150–450)
RBC: 4.69 x10E6/uL (ref 3.77–5.28)
RDW: 13.2 % (ref 11.7–15.4)
RPR Ser Ql: NONREACTIVE
Rh Factor: POSITIVE
Rubella Antibodies, IGG: 12.6 index (ref >0.99)
WBC: 8.7 10*3/uL (ref 3.4–10.8)

## 2018-10-20 LAB — CYTOLOGY - PAP
Adequacy: ABSENT
Diagnosis: NEGATIVE
HPV: DETECTED — AB

## 2018-10-20 LAB — CERVICOVAGINAL ANCILLARY ONLY
Bacterial vaginitis: NEGATIVE
Candida vaginitis: NEGATIVE
Chlamydia: NEGATIVE
Neisseria Gonorrhea: NEGATIVE
Trichomonas: NEGATIVE

## 2018-10-22 LAB — CULTURE, OB URINE

## 2018-10-22 LAB — URINE CULTURE, OB REFLEX

## 2018-10-25 ENCOUNTER — Encounter: Payer: Self-pay | Admitting: Obstetrics & Gynecology

## 2018-10-26 ENCOUNTER — Encounter: Payer: Self-pay | Admitting: Obstetrics & Gynecology

## 2018-10-27 ENCOUNTER — Encounter: Payer: Self-pay | Admitting: *Deleted

## 2018-11-08 ENCOUNTER — Other Ambulatory Visit: Payer: Self-pay

## 2018-11-08 ENCOUNTER — Telehealth: Payer: Self-pay

## 2018-11-08 DIAGNOSIS — L299 Pruritus, unspecified: Secondary | ICD-10-CM

## 2018-11-08 DIAGNOSIS — Z3490 Encounter for supervision of normal pregnancy, unspecified, unspecified trimester: Secondary | ICD-10-CM

## 2018-11-08 NOTE — Progress Notes (Signed)
Lab needs orders approved per Dr. Jodi Mourning

## 2018-11-08 NOTE — Telephone Encounter (Signed)
Return call to regarding message Pt c/o itching on set last week on hands and feet. Pt states itching is now worse all over her body not able to sleep at night. Consulted w/ provider Dr.Harper advised pt to come in for Bile Acids and CMET.  Pt notified to come to office now for lab only visit pt agreeable.

## 2018-11-09 ENCOUNTER — Other Ambulatory Visit: Payer: Self-pay | Admitting: Obstetrics

## 2018-11-09 DIAGNOSIS — B002 Herpesviral gingivostomatitis and pharyngotonsillitis: Secondary | ICD-10-CM

## 2018-11-09 MED ORDER — VALACYCLOVIR HCL 1 G PO TABS: ORAL_TABLET | ORAL | 5 refills | Status: DC

## 2018-11-10 LAB — COMPREHENSIVE METABOLIC PANEL
ALT: 40 IU/L — ABNORMAL HIGH (ref 0–32)
AST: 32 IU/L (ref 0–40)
Albumin/Globulin Ratio: 1.8 (ref 1.2–2.2)
Albumin: 4.2 g/dL (ref 3.9–5.0)
Alkaline Phosphatase: 58 IU/L (ref 39–117)
BUN/Creatinine Ratio: 11 (ref 9–23)
BUN: 5 mg/dL — ABNORMAL LOW (ref 6–20)
Bilirubin Total: 0.3 mg/dL (ref 0.0–1.2)
CO2: 19 mmol/L — ABNORMAL LOW (ref 20–29)
Calcium: 10.1 mg/dL (ref 8.7–10.2)
Chloride: 102 mmol/L (ref 96–106)
Creatinine, Ser: 0.46 mg/dL — ABNORMAL LOW (ref 0.57–1.00)
GFR calc Af Amer: 159 mL/min/{1.73_m2} (ref >59)
GFR calc non Af Amer: 138 mL/min/{1.73_m2} (ref >59)
Globulin, Total: 2.4 g/dL (ref 1.5–4.5)
Glucose: 79 mg/dL (ref 65–99)
Potassium: 4.6 mmol/L (ref 3.5–5.2)
Sodium: 137 mmol/L (ref 134–144)
Total Protein: 6.6 g/dL (ref 6.0–8.5)

## 2018-11-10 LAB — BILE ACIDS, TOTAL: Bile Acids Total: 1.3 umol/L (ref 0.0–10.0)

## 2018-11-21 ENCOUNTER — Other Ambulatory Visit: Payer: Self-pay

## 2018-11-21 ENCOUNTER — Ambulatory Visit: Payer: Self-pay

## 2018-11-21 ENCOUNTER — Telehealth: Payer: Self-pay

## 2018-11-21 DIAGNOSIS — R399 Unspecified symptoms and signs involving the genitourinary system: Secondary | ICD-10-CM

## 2018-11-21 NOTE — Addendum Note (Signed)
Addended by: Mora Bellman on: 11/21/2018 02:04 PM   Modules accepted: Orders

## 2018-11-21 NOTE — Progress Notes (Signed)
Pt presents for a urine dip. Pt complains of noticing blood in her urine this am. Pt urine has a peach like color to it. I asked patient if she has taken anything AZO or anything for uti treatment. She denies taking anything other than her prenatal vitamins. She states that she has been taking these since early pregnancy, and this is the first time she noticed her urine this color. She states that her prenatal vitamin bottle does state that it can change the color of her urine. I asked patient if she is experiencing any other symptoms, and she states that she does sometime have pressure with urination. I did dip pt urine x2 but strip turned orange due to color of patient's urine.

## 2018-11-21 NOTE — Telephone Encounter (Signed)
Pt called and left a message on triage vm stating that she noticed a small amount of bleeding in her urine. She also complains of having some pressure with urination, and lower abdominal cramping. Pt is coming to the office to leave a urine specimen. I did confirm with patient that the bleeding is not coming from her vagina.

## 2018-11-21 NOTE — Progress Notes (Signed)
Patient seen and assessed by nursing staff during this encounter. I have reviewed the chart and agree with the documentation and plan.  Mora Bellman, MD 11/21/2018 2:03 PM

## 2018-11-23 ENCOUNTER — Other Ambulatory Visit: Payer: Self-pay | Admitting: Obstetrics and Gynecology

## 2018-11-23 LAB — URINE CULTURE: Organism ID, Bacteria: NO GROWTH

## 2018-11-23 LAB — URINE CULTURE, OB REFLEX

## 2018-11-23 LAB — CULTURE, OB URINE

## 2018-11-23 MED ORDER — CEPHALEXIN 500 MG PO CAPS: 500.0000 mg | ORAL_CAPSULE | Freq: Four times a day (QID) | ORAL | 2 refills | Status: DC

## 2018-11-28 ENCOUNTER — Ambulatory Visit: Payer: No Typology Code available for payment source

## 2018-12-13 ENCOUNTER — Other Ambulatory Visit: Payer: Self-pay

## 2018-12-13 ENCOUNTER — Ambulatory Visit (INDEPENDENT_AMBULATORY_CARE_PROVIDER_SITE_OTHER): Payer: Self-pay | Admitting: Certified Nurse Midwife

## 2018-12-13 ENCOUNTER — Encounter: Payer: Self-pay | Admitting: Certified Nurse Midwife

## 2018-12-13 VITALS — BP 114/77 | HR 88 | Wt 185.6 lb

## 2018-12-13 DIAGNOSIS — Z3490 Encounter for supervision of normal pregnancy, unspecified, unspecified trimester: Secondary | ICD-10-CM

## 2018-12-13 DIAGNOSIS — Z3A19 19 weeks gestation of pregnancy: Secondary | ICD-10-CM

## 2018-12-13 DIAGNOSIS — O99212 Obesity complicating pregnancy, second trimester: Secondary | ICD-10-CM

## 2018-12-13 DIAGNOSIS — O99210 Obesity complicating pregnancy, unspecified trimester: Secondary | ICD-10-CM

## 2018-12-13 NOTE — Progress Notes (Signed)
   PRENATAL VISIT NOTE  Subjective:  Tara Mathews is a 28 y.o. G1P0 at [redacted]w[redacted]d being seen today for ongoing prenatal care.  She is currently monitored for the following issues for this low-risk pregnancy and has Missed period; Encounter for supervision of normal pregnancy; and Obesity affecting pregnancy, antepartum on their problem list.  Patient reports no complaints.  Contractions: Not present. Vag. Bleeding: None.   . Denies leaking of fluid.   The following portions of the patient's history were reviewed and updated as appropriate: allergies, current medications, past family history, past medical history, past social history, past surgical history and problem list.   Objective:   Vitals:   12/13/18 1103  BP: 114/77  Pulse: 88  Weight: 185 lb 9.6 oz (84.2 kg)    Fetal Status: Fetal Heart Rate (bpm): 150         General:  Alert, oriented and cooperative. Patient is in no acute distress.  Skin: Skin is warm and dry. No rash noted.   Cardiovascular: Normal heart rate noted  Respiratory: Normal respiratory effort, no problems with respiration noted  Abdomen: Soft, gravid, appropriate for gestational age.  Pain/Pressure: Present     Pelvic: Cervical exam deferred        Extremities: Normal range of motion.  Edema: None  Mental Status: Normal mood and affect. Normal behavior. Normal judgment and thought content.   Assessment and Plan:  Pregnancy: G1P0 at [redacted]w[redacted]d 1. Encounter for supervision of normal pregnancy, antepartum, unspecified gravidity - Patient doing well, no complaints  - Routine prenatal care  - Anticipatory guidance on upcoming appointments  - Educated on fetal movement during pregnancy and what to expect at this time  - AFP, Serum, Open Spina Bifida  2. Obesity affecting pregnancy, antepartum - Educated and reviewed recommended weight gain during pregnancy   Preterm labor symptoms and general obstetric precautions including but not limited to vaginal  bleeding, contractions, leaking of fluid and fetal movement were reviewed in detail with the patient. Please refer to After Visit Summary for other counseling recommendations.   Return in about 8 weeks (around 02/07/2019) for ROB/GTT.  Future Appointments  Date Time Provider [place]  02/07/2019  8:00 AM [facility] None  02/07/2019  8:15 AM Lavonia Drafts, MD [place] None    Lajean Manes, CNM

## 2018-12-13 NOTE — Progress Notes (Signed)
Pt is here for ROB. [redacted]w[redacted]d. Has anatomy scan [place] scheduled for tomorrow.

## 2018-12-13 NOTE — Patient Instructions (Addendum)
Glucose Tolerance Test During Pregnancy Why am I having this test? The glucose tolerance test (GTT) is done to check how your body processes sugar (glucose). This is one of several tests used to diagnose diabetes that develops during pregnancy (gestational diabetes mellitus). Gestational diabetes is a temporary form of diabetes that some women develop during pregnancy. It usually occurs during the second trimester of pregnancy and goes away after delivery. Testing (screening) for gestational diabetes usually occurs between 24 and 28 weeks of pregnancy. You may have the GTT test after having a 1-hour glucose screening test if the results from that test indicate that you may have gestational diabetes. You may also have this test if:  You have a history of gestational diabetes.  You have a history of giving birth to very large babies or have experienced repeated fetal loss (stillbirth).  You have signs and symptoms of diabetes, such as: ? Changes in your vision. ? Tingling or numbness in your hands or feet. ? Changes in hunger, thirst, and urination that are not otherwise explained by your pregnancy. What is being tested? This test measures the amount of glucose in your blood at different times during a period of 3 hours. This indicates how well your body is able to process glucose. What kind of sample is taken?  Blood samples are required for this test. They are usually collected by inserting a needle into a blood vessel. How do I prepare for this test?  For 3 days before your test, eat normally. Have plenty of carbohydrate-rich foods.  Follow instructions from your health care provider about: ? Eating or drinking restrictions on the day of the test. You may be asked to not eat or drink anything other than water (fast) starting 8-10 hours before the test. ? Changing or stopping your regular medicines. Some medicines may interfere with this test. Tell a health care provider about:  All  medicines you are taking, including vitamins, herbs, eye drops, creams, and over-the-counter medicines.  Any blood disorders you have.  Any surgeries you have had.  Any medical conditions you have. What happens during the test? First, your blood glucose will be measured. This is referred to as your fasting blood glucose, since you fasted before the test. Then, you will drink a glucose solution that contains a certain amount of glucose. Your blood glucose will be measured again 1, 2, and 3 hours after drinking the solution. This test takes about 3 hours to complete. You will need to stay at the testing location during this time. During the testing period:  Do not eat or drink anything other than the glucose solution.  Do not exercise.  Do not use any products that contain nicotine or tobacco, such as cigarettes and e-cigarettes. If you need help stopping, ask your health care provider. The testing procedure may vary among health care providers and hospitals. How are the results reported? Your results will be reported as milligrams of glucose per deciliter of blood (mg/dL) or millimoles per liter (mmol/L). Your health care provider will compare your results to normal ranges that were established after testing a large group of people (reference ranges). Reference ranges may vary among labs and hospitals. For this test, common reference ranges are:  Fasting: less than 95-105 mg/dL (5.3-5.8 mmol/L).  1 hour after drinking glucose: less than 180-190 mg/dL (10.0-10.5 mmol/L).  2 hours after drinking glucose: less than 155-165 mg/dL (8.6-9.2 mmol/L).  3 hours after drinking glucose: 140-145 mg/dL (7.8-8.1 mmol/L). What do the   results mean? Results within reference ranges are considered normal, meaning that your glucose levels are well-controlled. If two or more of your blood glucose levels are high, you may be diagnosed with gestational diabetes. If only one level is high, your health care  provider may suggest repeat testing or other tests to confirm a diagnosis. Talk with your health care provider about what your results mean. Questions to ask your health care provider Ask your health care provider, or the department that is doing the test:  When will my results be ready?  How will I get my results?  What are my treatment options?  What other tests do I need?  What are my next steps? Summary  The glucose tolerance test (GTT) is one of several tests used to diagnose diabetes that develops during pregnancy (gestational diabetes mellitus). Gestational diabetes is a temporary form of diabetes that some women develop during pregnancy.  You may have the GTT test after having a 1-hour glucose screening test if the results from that test indicate that you may have gestational diabetes. You may also have this test if you have any symptoms or risk factors for gestational diabetes.  Talk with your health care provider about what your results mean. This information is not intended to replace advice given to you by your health care provider. Make sure you discuss any questions you have with your health care provider. Document Released: 09/28/2011 Document Revised: 07/20/2018 Document Reviewed: 11/08/2016 Elsevier Patient Education  [place].   Round Ligament Pain  The round ligament is a cord of muscle and tissue that helps support the uterus. It can become a source of pain during pregnancy if it becomes stretched or twisted as the baby grows. The pain usually begins in the second trimester (13-28 weeks) of pregnancy, and it can come and go until the baby is delivered. It is not a serious problem, and it does not cause harm to the baby. Round ligament pain is usually a short, sharp, and pinching pain, but it can also be a dull, lingering, and aching pain. The pain is felt in the lower side of the abdomen or in the groin. It usually starts deep in the groin and moves up to  the outside of the hip area. The pain may occur when you:  Suddenly change position, such as quickly going from a sitting to standing position.  Roll over in bed.  Cough or sneeze.  Do physical activity. Follow these instructions at home:   Watch your condition for any changes.  When the pain starts, relax. Then try any of these methods to help with the pain: ? Sitting down. ? Flexing your knees up to your abdomen. ? Lying on your side with one pillow under your abdomen and another pillow between your legs. ? Sitting in a warm bath for 15-20 minutes or until the pain goes away.  Take over-the-counter and prescription medicines only as told by your health care provider.  Move slowly when you sit down or stand up.  Avoid long walks if they cause pain.  Stop or reduce your physical activities if they cause pain.  Keep all follow-up visits as told by your health care provider. This is important. Contact a health care provider if:  Your pain does not go away with treatment.  You feel pain in your back that you did not have before.  Your medicine is not helping. Get help right away if:  You have a fever or  chills.  You develop uterine contractions.  You have vaginal bleeding.  You have nausea or vomiting.  You have diarrhea.  You have pain when you urinate. Summary  Round ligament pain is felt in the lower abdomen or groin. It is usually a short, sharp, and pinching pain. It can also be a dull, lingering, and aching pain.  This pain usually begins in the second trimester (13-28 weeks). It occurs because the uterus is stretching with the growing baby, and it is not harmful to the baby.  You may notice the pain when you suddenly change position, when you cough or sneeze, or during physical activity.  Relaxing, flexing your knees to your abdomen, lying on one side, or taking a warm bath may help to get rid of the pain.  Get help from your health care provider if the  pain does not go away or if you have vaginal bleeding, nausea, vomiting, diarrhea, or painful urination. This information is not intended to replace advice given to you by your health care provider. Make sure you discuss any questions you have with your health care provider. Document Released: 01/06/2008 Document Revised: 09/14/2017 Document Reviewed: 09/14/2017 Elsevier Patient Education  2020 [employer].

## 2018-12-15 LAB — AFP, SERUM, OPEN SPINA BIFIDA
AFP MoM: 1.42
AFP Value: 60.1 ng/mL
Gest. Age on Collection Date: 19 weeks
Maternal Age At EDD: 27.1 yr
OSBR Risk 1 IN: 3404
Test Results:: NEGATIVE
Weight: 187 [lb_av]

## 2019-02-07 ENCOUNTER — Other Ambulatory Visit: Payer: Self-pay

## 2019-02-07 ENCOUNTER — Ambulatory Visit (INDEPENDENT_AMBULATORY_CARE_PROVIDER_SITE_OTHER): Payer: Self-pay | Admitting: Obstetrics & Gynecology

## 2019-02-07 VITALS — BP 117/78 | HR 88 | Wt 198.0 lb

## 2019-02-07 DIAGNOSIS — Z3A27 27 weeks gestation of pregnancy: Secondary | ICD-10-CM

## 2019-02-07 DIAGNOSIS — Z3490 Encounter for supervision of normal pregnancy, unspecified, unspecified trimester: Secondary | ICD-10-CM

## 2019-02-07 DIAGNOSIS — O99210 Obesity complicating pregnancy, unspecified trimester: Secondary | ICD-10-CM

## 2019-02-07 DIAGNOSIS — O99212 Obesity complicating pregnancy, second trimester: Secondary | ICD-10-CM

## 2019-02-07 DIAGNOSIS — Z3400 Encounter for supervision of normal first pregnancy, unspecified trimester: Secondary | ICD-10-CM

## 2019-02-07 NOTE — Progress Notes (Signed)
   PRENATAL VISIT NOTE  Subjective:  Tara Mathews is a 28 y.o. G1P0 at [redacted]w[redacted]d being seen today for ongoing prenatal care.  She is currently monitored for the following issues for this low-risk pregnancy and has Encounter for supervision of normal pregnancy and Obesity affecting pregnancy, antepartum on their problem list.  Patient reports no complaints.  Contractions: Not present. Vag. Bleeding: None.  Movement: Present. Denies leaking of fluid.   The following portions of the patient's history were reviewed and updated as appropriate: allergies, current medications, past family history, past medical history, past social history, past surgical history and problem list.   Objective:   Vitals:   02/07/19 0818  BP: 117/78  Pulse: 88  Weight: 198 lb (89.8 kg)    Fetal Status:     Movement: Present     General:  Alert, oriented and cooperative. Patient is in no acute distress.  Skin: Skin is warm and dry. No rash noted.   Cardiovascular: Normal heart rate noted  Respiratory: Normal respiratory effort, no problems with respiration noted  Abdomen: Soft, gravid, appropriate for gestational age.  Pain/Pressure: Present     Pelvic: Cervical exam deferred        Extremities: Normal range of motion.     Mental Status: Normal mood and affect. Normal behavior. Normal judgment and thought content.   Assessment and Plan:  Pregnancy: G1P0 at [redacted]w[redacted]d 1. Supervision of normal first pregnancy, antepartum Good FM FH WNL 2 hour GTT and 28 week labs today  2. Obesity affecting pregnancy, antepartum  Preterm labor symptoms and general obstetric precautions including but not limited to vaginal bleeding, contractions, leaking of fluid and fetal movement were reviewed in detail with the patient. Please refer to After Visit Summary for other counseling recommendations.   No follow-ups on file.  No future appointments.  Lavonia Drafts, MD

## 2019-02-07 NOTE — Progress Notes (Signed)
Pt made aware that [facility]

## 2019-02-08 LAB — CBC
Hematocrit: 37.4 % (ref 34.0–46.6)
Hemoglobin: 12.3 g/dL (ref 11.1–15.9)
MCH: 28.8 pg (ref 26.6–33.0)
MCHC: 32.9 g/dL (ref 31.5–35.7)
MCV: 88 fL (ref 79–97)
Platelets: 333 10*3/uL (ref 150–450)
RBC: 4.27 x10E6/uL (ref 3.77–5.28)
RDW: 12.5 % (ref 11.7–15.4)
WBC: 10.5 10*3/uL (ref 3.4–10.8)

## 2019-02-08 LAB — GLUCOSE TOLERANCE, 2 HOURS W/ 1HR
Glucose, 1 hour: 110 mg/dL (ref 65–179)
Glucose, 2 hour: 102 mg/dL (ref 65–152)
Glucose, Fasting: 75 mg/dL (ref 65–91)

## 2019-02-08 LAB — RPR: RPR Ser Ql: NONREACTIVE

## 2019-02-08 LAB — HIV ANTIBODY (ROUTINE TESTING W REFLEX): HIV Screen 4th Generation wRfx: NONREACTIVE

## 2019-02-19 ENCOUNTER — Telehealth: Payer: Self-pay

## 2019-02-19 NOTE — Telephone Encounter (Signed)
Pt made aware of safe OTC cold/cough medications to take and if she develops fever, chills, nausea vomiting, etc to report to MAU.

## 2019-02-21 ENCOUNTER — Telehealth (INDEPENDENT_AMBULATORY_CARE_PROVIDER_SITE_OTHER): Payer: Self-pay | Admitting: Certified Nurse Midwife

## 2019-02-21 ENCOUNTER — Encounter: Payer: Self-pay | Admitting: Certified Nurse Midwife

## 2019-02-21 VITALS — BP 120/84 | HR 106

## 2019-02-21 DIAGNOSIS — Z3A29 29 weeks gestation of pregnancy: Secondary | ICD-10-CM

## 2019-02-21 DIAGNOSIS — O99213 Obesity complicating pregnancy, third trimester: Secondary | ICD-10-CM

## 2019-02-21 DIAGNOSIS — O99210 Obesity complicating pregnancy, unspecified trimester: Secondary | ICD-10-CM

## 2019-02-21 DIAGNOSIS — Z3400 Encounter for supervision of normal first pregnancy, unspecified trimester: Secondary | ICD-10-CM

## 2019-02-21 NOTE — Progress Notes (Signed)
I connected with  Tara Mathews on 02/21/19 by a video enabled telemedicine application and verified that I am speaking with the correct person using two identifiers.

## 2019-02-21 NOTE — Progress Notes (Signed)
   TELEHEALTH OBSTETRICS PRENATAL VIRTUAL VIDEO VISIT ENCOUNTER NOTE  Provider location: [facility] [place] [facility] [place]   I connected with Tara Mathews on 02/21/19 at  9:16 AM EST by MyChart Video Encounter at home and verified that I am speaking with the correct person using two identifiers.   I discussed the limitations, risks, security and privacy concerns of performing an evaluation and management service virtually and the availability of in person appointments. I also discussed with the patient that there may be a patient responsible charge related to this service. The patient expressed understanding and agreed to proceed. Subjective:  Tara Mathews is a 28 y.o. G1P0 at [redacted]w[redacted]d being seen today for ongoing prenatal care.  She is currently monitored for the following issues for this low-risk pregnancy and has Encounter for supervision of normal pregnancy and Obesity affecting pregnancy, antepartum on their problem list.  Patient reports cold symptoms .  Contractions: Not present. Vag. Bleeding: None.  Movement: Present. Denies any leaking of fluid.   The following portions of the patient's history were reviewed and updated as appropriate: allergies, current medications, past family history, past medical history, past social history, past surgical history and problem list.   Objective:   Vitals:   02/21/19 0910  BP: 120/84  Pulse: (!) 106    Fetal Status:     Movement: Present     General:  Alert, oriented and cooperative. Patient is in no acute distress.  Respiratory: Normal respiratory effort, no problems with respiration noted  Mental Status: Normal mood and affect. Normal behavior. Normal judgment and thought content.  Rest of physical exam deferred due to type of encounter  Imaging: No results found.  Assessment and Plan:  Pregnancy: G1P0 at [redacted]w[redacted]d 1. Supervision of normal first pregnancy, antepartum - Patient doing well, reports she has been sick  over the past couple of day believes it to be a head cold but did get tested for [place] yesterday- results pending  - No pregnancy related complaints  - Educated and discussed safe medications during pregnancy that she can take with cold symptoms, patient reports having a temp this past weekend that resolved on own. Discussed recommendations with patient on if she has a fever again - take tylenol, hydrate, eat and rest. Count fetal movement, patient verbalizes understanding  - Routine prenatal care - Anticipatory guidance on upcoming appointments - Anatomy US from [facility] not in chart - plan to contact [facility] to get results and review with patient.  - reviewed third trimester labs   2. Obesity affecting pregnancy, antepartum   Preterm labor symptoms and general obstetric precautions including but not limited to vaginal bleeding, contractions, leaking of fluid and fetal movement were reviewed in detail with the patient. I discussed the assessment and treatment plan with the patient. The patient was provided an opportunity to ask questions and all were answered. The patient agreed with the plan and demonstrated an understanding of the instructions. The patient was advised to call back or seek an in-person office evaluation/go to [facility]U at [facility] for any urgent or concerning symptoms. Please refer to After Visit Summary for other counseling recommendations.   I provided 12 minutes of face-to-face time during this encounter.  Return in about 3 weeks (around 03/14/2019) for ROB.  Lajean Manes, [place] for [employer], [place]

## 2019-03-15 ENCOUNTER — Telehealth (INDEPENDENT_AMBULATORY_CARE_PROVIDER_SITE_OTHER): Payer: Self-pay | Admitting: Certified Nurse Midwife

## 2019-03-15 ENCOUNTER — Encounter: Payer: Self-pay | Admitting: Certified Nurse Midwife

## 2019-03-15 VITALS — BP 121/79

## 2019-03-15 DIAGNOSIS — Z3A32 32 weeks gestation of pregnancy: Secondary | ICD-10-CM

## 2019-03-15 DIAGNOSIS — O99213 Obesity complicating pregnancy, third trimester: Secondary | ICD-10-CM

## 2019-03-15 DIAGNOSIS — O99210 Obesity complicating pregnancy, unspecified trimester: Secondary | ICD-10-CM

## 2019-03-15 DIAGNOSIS — Z3400 Encounter for supervision of normal first pregnancy, unspecified trimester: Secondary | ICD-10-CM

## 2019-03-15 NOTE — Progress Notes (Signed)
TELEHEALTH OBSTETRICS PRENATAL VIRTUAL VIDEO VISIT ENCOUNTER NOTE  Provider location: [facility] [place] at [place]   I connected with Tara Mathews on 03/15/19 at  9:14 AM EST by MyChart Video Encounter at home and verified that I am speaking with the correct person using two identifiers.   I discussed the limitations, risks, security and privacy concerns of performing an evaluation and management service virtually and the availability of in person appointments. I also discussed with the patient that there may be a patient responsible charge related to this service. The patient expressed understanding and agreed to proceed. Subjective:  Tara Mathews is a 28 y.o. G1P0 at [redacted]w[redacted]d being seen today for ongoing prenatal care.  She is currently monitored for the following issues for this low-risk pregnancy and has Encounter for supervision of normal pregnancy and Obesity affecting pregnancy, antepartum on their problem list.  Patient reports reports new skin tags for the past 2 weeks.  Contractions: Not present. Vag. Bleeding: None.  Movement: Present. Denies any leaking of fluid.   The following portions of the patient's history were reviewed and updated as appropriate: allergies, current medications, past family history, past medical history, past social history, past surgical history and problem list.   Objective:   Vitals:   03/15/19 0900  BP: 121/79    Fetal Status:     Movement: Present     General:  Alert, oriented and cooperative. Patient is in no acute distress.  Respiratory: Normal respiratory effort, no problems with respiration noted  Mental Status: Normal mood and affect. Normal behavior. Normal judgment and thought content.  Rest of physical exam deferred due to type of encounter  Imaging: No results found.  Assessment and Plan:  Pregnancy: G1P0 at [redacted]w[redacted]d 1. Supervision of normal first pregnancy, antepartum - Patient doing well, reports new  onset of "skin tags" that are present on her neck, axilla and the most concerning for her is her groin  - Skin tags are present in the area of moisture and usually caused d/t friction and moisture- instructed patient to keep skin dry and use small amount of powder or chafing creme to reduce friction  - Discussed with patient that next appointment will be in office to assess skin tags and discuss if removal is necessary- patient verbalizes understanding  - Routine prenatal care - Anticipatory guidance on upcoming appointments including GBS screening at 36 week appointment   2. Obesity affecting pregnancy, antepartum - Patient is in [facility] a mom program  - Has gotten US performed but they are not scanned into chart - message sent to office to have them scanned into chart   Preterm labor symptoms and general obstetric precautions including but not limited to vaginal bleeding, contractions, leaking of fluid and fetal movement were reviewed in detail with the patient. I discussed the assessment and treatment plan with the patient. The patient was provided an opportunity to ask questions and all were answered. The patient agreed with the plan and demonstrated an understanding of the instructions. The patient was advised to call back or seek an in-person office evaluation/go to MAU at [facility] for any urgent or concerning symptoms. Please refer to After Visit Summary for other counseling recommendations.   I provided 12 minutes of face-to-face time during this encounter.  Return in about 2 weeks (around 03/29/2019) for ROB/in person- ?vulva polyp or skin tag.  Future Appointments  Date Time Provider [place]  03/28/2019  9:45 AM [name]  A, MD [place], [place] for [employer], [place]

## 2019-03-15 NOTE — Progress Notes (Signed)
Pt is on the phone preparing for virtual visit with provider. [redacted]w[redacted]d. Pt reports she was diagnosed with COVID-19 28 days ago, pt reports very mild symptoms of nausea and fatigue.

## 2019-03-28 ENCOUNTER — Ambulatory Visit (INDEPENDENT_AMBULATORY_CARE_PROVIDER_SITE_OTHER): Payer: Self-pay | Admitting: Obstetrics

## 2019-03-28 ENCOUNTER — Other Ambulatory Visit: Payer: Self-pay

## 2019-03-28 VITALS — BP 120/80 | HR 80 | Wt 202.0 lb

## 2019-03-28 DIAGNOSIS — O99210 Obesity complicating pregnancy, unspecified trimester: Secondary | ICD-10-CM

## 2019-03-28 DIAGNOSIS — O99213 Obesity complicating pregnancy, third trimester: Secondary | ICD-10-CM

## 2019-03-28 DIAGNOSIS — Z3400 Encounter for supervision of normal first pregnancy, unspecified trimester: Secondary | ICD-10-CM

## 2019-03-28 DIAGNOSIS — Z3A34 34 weeks gestation of pregnancy: Secondary | ICD-10-CM

## 2019-03-29 ENCOUNTER — Encounter: Payer: No Typology Code available for payment source | Admitting: Obstetrics & Gynecology

## 2019-04-02 ENCOUNTER — Encounter: Payer: Self-pay | Admitting: Obstetrics

## 2019-04-02 NOTE — Progress Notes (Signed)
   TELEHEALTH OBSTETRICS PRENATAL VIRTUAL VIDEO VISIT ENCOUNTER NOTE  Provider location: [facility] [place] at [place]   I connected with Tara Mathews on 04/02/19 at  9:45 AM EST by [facility] MyChart Video Encounter at home and verified that I am speaking with the correct person using two identifiers.   I discussed the limitations, risks, security and privacy concerns of performing an evaluation and management service virtually and the availability of in person appointments. I also discussed with the patient that there may be a patient responsible charge related to this service. The patient expressed understanding and agreed to proceed. Subjective:  Tara Mathews is a 28 y.o. G1P0 at [redacted]w[redacted]d being seen today for ongoing prenatal care.  She is currently monitored for the following issues for this low-risk pregnancy and has Encounter for supervision of normal pregnancy and Obesity affecting pregnancy, antepartum on their problem list.  Patient reports no complaints.  Contractions: Irritability. Vag. Bleeding: None.  Movement: Present. Denies any leaking of fluid.   The following portions of the patient's history were reviewed and updated as appropriate: allergies, current medications, past family history, past medical history, past social history, past surgical history and problem list.   Objective:   Vitals:   03/28/19 0951  BP: 120/80  Pulse: 80  Weight: 202 lb (91.6 kg)    Fetal Status:     Movement: Present     General:  Alert, oriented and cooperative. Patient is in no acute distress.  Respiratory: Normal respiratory effort, no problems with respiration noted  Mental Status: Normal mood and affect. Normal behavior. Normal judgment and thought content.  Rest of physical exam deferred due to type of encounter  Imaging: No results found.  Assessment and Plan:  Pregnancy: G1P0 at [redacted]w[redacted]d There are no diagnoses linked to this encounter. Preterm labor symptoms and  general obstetric precautions including but not limited to vaginal bleeding, contractions, leaking of fluid and fetal movement were reviewed in detail with the patient. I discussed the assessment and treatment plan with the patient. The patient was provided an opportunity to ask questions and all were answered. The patient agreed with the plan and demonstrated an understanding of the instructions. The patient was advised to call back or seek an in-person office evaluation/go to MAU at [facility] for any urgent or concerning symptoms. Please refer to After Visit Summary for other counseling recommendations.   I provided 10 minutes of face-to-face time during this encounter.  No follow-ups on file.  Future Appointments  Date Time Provider [place]  04/11/2019  8:55 AM Luvenia Redden, PA-C [facility] None    [name], [place] for [facility], [facility] 04/02/2019

## 2019-04-11 ENCOUNTER — Ambulatory Visit (INDEPENDENT_AMBULATORY_CARE_PROVIDER_SITE_OTHER): Payer: Self-pay | Admitting: Medical

## 2019-04-11 ENCOUNTER — Other Ambulatory Visit: Payer: Self-pay

## 2019-04-11 ENCOUNTER — Encounter: Payer: Self-pay | Admitting: Medical

## 2019-04-11 VITALS — BP 128/77 | HR 99 | Wt 206.0 lb

## 2019-04-11 DIAGNOSIS — Z113 Encounter for screening for infections with a predominantly sexual mode of transmission: Secondary | ICD-10-CM

## 2019-04-11 DIAGNOSIS — O99210 Obesity complicating pregnancy, unspecified trimester: Secondary | ICD-10-CM

## 2019-04-11 DIAGNOSIS — Z3A36 36 weeks gestation of pregnancy: Secondary | ICD-10-CM

## 2019-04-11 DIAGNOSIS — Z3400 Encounter for supervision of normal first pregnancy, unspecified trimester: Secondary | ICD-10-CM

## 2019-04-11 DIAGNOSIS — O99213 Obesity complicating pregnancy, third trimester: Secondary | ICD-10-CM

## 2019-04-11 NOTE — Patient Instructions (Signed)
Fetal Movement Counts Patient Name: ________________________________________________ Patient Due Date: ____________________ What is a fetal movement count?  A fetal movement count is the number of times that you feel your baby move during a certain amount of time. This may also be called a fetal kick count. A fetal movement count is recommended for every pregnant woman. You may be asked to start counting fetal movements as early as week 28 of your pregnancy. Pay attention to when your baby is most active. You may notice your baby's sleep and wake cycles. You may also notice things that make your baby move more. You should do a fetal movement count:  When your baby is normally most active.  At the same time each day. A good time to count movements is while you are resting, after having something to eat and drink. How do I count fetal movements? 1. Find a quiet, comfortable area. Sit, or lie down on your side. 2. Write down the date, the start time and stop time, and the number of movements that you felt between those two times. Take this information with you to your health care visits. 3. For 2 hours, count kicks, flutters, swishes, rolls, and jabs. You should feel at least 10 movements during 2 hours. 4. You may stop counting after you have felt 10 movements. 5. If you do not feel 10 movements in 2 hours, have something to eat and drink. Then, keep resting and counting for 1 hour. If you feel at least 4 movements during that hour, you may stop counting. Contact a health care provider if:  You feel fewer than 4 movements in 2 hours.  Your baby is not moving like he or she usually does. Date: ____________ Start time: ____________ Stop time: ____________ Movements: ____________ Date: ____________ Start time: ____________ Stop time: ____________ Movements: ____________ Date: ____________ Start time: ____________ Stop time: ____________ Movements: ____________ Date: ____________ Start time:  ____________ Stop time: ____________ Movements: ____________ Date: ____________ Start time: ____________ Stop time: ____________ Movements: ____________ Date: ____________ Start time: ____________ Stop time: ____________ Movements: ____________ Date: ____________ Start time: ____________ Stop time: ____________ Movements: ____________ Date: ____________ Start time: ____________ Stop time: ____________ Movements: ____________ Date: ____________ Start time: ____________ Stop time: ____________ Movements: ____________ This information is not intended to replace advice given to you by your health care provider. Make sure you discuss any questions you have with your health care provider. Document Released: 04/28/2006 Document Revised: 04/18/2018 Document Reviewed: 05/08/2015 Elsevier Patient Education  2020 Elsevier Inc[employer] Braxton Hicks Contractions Contractions of the uterus can occur throughout pregnancy, but they are not always a sign that you are in labor. You may have practice contractions called Braxton Hicks contractions. These false labor contractions are sometimes confused with true labor. What are Braxton Hicks contractions? Braxton Hicks contractions are tightening movements that occur in the muscles of the uterus before labor. Unlike true labor contractions, these contractions do not result in opening (dilation) and thinning of the cervix. Toward the end of pregnancy (32-34 weeks), Braxton Hicks contractions can happen more often and may become stronger. These contractions are sometimes difficult to tell apart from true labor because they can be very uncomfortable. You should not feel embarrassed if you go to the hospital with false labor. Sometimes, the only way to tell if you are in true labor is for your health care provider to look for changes in the cervix. The health care provider will do a physical exam and may monitor your contractions. If you   are not in true labor, the exam should show  that your cervix is not dilating and your water has not broken. If there are no other health problems associated with your pregnancy, it is completely safe for you to be sent home with false labor. You may continue to have Braxton Hicks contractions until you go into true labor. How to tell the difference between true labor and false labor True labor  Contractions last 30-70 seconds.  Contractions become very regular.  Discomfort is usually felt in the top of the uterus, and it spreads to the lower abdomen and low back.  Contractions do not go away with walking.  Contractions usually become more intense and increase in frequency.  The cervix dilates and gets thinner. False labor  Contractions are usually shorter and not as strong as true labor contractions.  Contractions are usually irregular.  Contractions are often felt in the front of the lower abdomen and in the groin.  Contractions may go away when you walk around or change positions while lying down.  Contractions get weaker and are shorter-lasting as time goes on.  The cervix usually does not dilate or become thin. Follow these instructions at home:   Take over-the-counter and prescription medicines only as told by your health care provider.  Keep up with your usual exercises and follow other instructions from your health care provider.  Eat and drink lightly if you think you are going into labor.  If Braxton Hicks contractions are making you uncomfortable: ? Change your position from lying down or resting to walking, or change from walking to resting. ? Sit and rest in a tub of warm water. ? Drink enough fluid to keep your urine pale yellow. Dehydration may cause these contractions. ? Do slow and deep breathing several times an hour.  Keep all follow-up prenatal visits as told by your health care provider. This is important. Contact a health care provider if:  You have a fever.  You have continuous pain in  your abdomen. Get help right away if:  Your contractions become stronger, more regular, and closer together.  You have fluid leaking or gushing from your vagina.  You pass blood-tinged mucus (bloody show).  You have bleeding from your vagina.  You have low back pain that you never had before.  You feel your baby's head pushing down and causing pelvic pressure.  Your baby is not moving inside you as much as it used to. Summary  Contractions that occur before labor are called Braxton Hicks contractions, false labor, or practice contractions.  Braxton Hicks contractions are usually shorter, weaker, farther apart, and less regular than true labor contractions. True labor contractions usually become progressively stronger and regular, and they become more frequent.  Manage discomfort from Braxton Hicks contractions by changing position, resting in a warm bath, drinking plenty of water, or practicing deep breathing. This information is not intended to replace advice given to you by your health care provider. Make sure you discuss any questions you have with your health care provider. Document Released: 08/12/2016 Document Revised: 03/11/2017 Document Reviewed: 08/12/2016 Elsevier Patient Education  2020 Elsevier Inc.  

## 2019-04-11 NOTE — Progress Notes (Signed)
ROB/GBS.  Reports no problems today.  Letter given to get TDAP at [facility].

## 2019-04-11 NOTE — Progress Notes (Signed)
   PRENATAL VISIT NOTE  Subjective:  Tara Mathews is a 28 y.o. G1P0 at [redacted]w[redacted]d being seen today for ongoing prenatal care.  She is currently monitored for the following issues for this low-risk pregnancy and has Encounter for supervision of normal pregnancy and Obesity affecting pregnancy, antepartum on their problem list.  Patient reports occasional contractions.  Contractions: Irritability. Vag. Bleeding: None.  Movement: Present. Denies leaking of fluid.   The following portions of the patient's history were reviewed and updated as appropriate: allergies, current medications, past family history, past medical history, past social history, past surgical history and problem list.   Objective:   Vitals:   04/11/19 0902  BP: 128/77  Pulse: 99  Weight: 206 lb (93.4 kg)    Fetal Status: Fetal Heart Rate (bpm): 146   Movement: Present  Presentation: Vertex  General:  Alert, oriented and cooperative. Patient is in no acute distress.  Skin: Skin is warm and dry. No rash noted.   Cardiovascular: Normal heart rate noted  Respiratory: Normal respiratory effort, no problems with respiration noted  Abdomen: Soft, gravid, appropriate for gestational age.  Pain/Pressure: Present     Pelvic: Cervical exam performed Dilation: Closed Effacement (%): 50 Station: -3  Extremities: Normal range of motion.  Edema: Trace  Mental Status: Normal mood and affect. Normal behavior. Normal judgment and thought content.   Assessment and Plan:  Pregnancy: G1P0 at [redacted]w[redacted]d 1. Supervision of normal first pregnancy, antepartum - Doing well - MOC discussed, planning POPs - Still undecided on Peds, encouraged decision prior to admission and advised to call office of choice for more information  - Not planning circ at this time  - Strep Gp B NAA - Cervicovaginal ancillary only( [place])  2. Obesity affecting pregnancy, antepartum - 2# TWG to date   Preterm labor symptoms and general obstetric  precautions including but not limited to vaginal bleeding, contractions, leaking of fluid and fetal movement were reviewed in detail with the patient. Please refer to After Visit Summary for other counseling recommendations.   Return in about 2 weeks (around 04/25/2019) for LOB, Virtual.  No future appointments.  Kerry Hough, PA-C

## 2019-04-12 LAB — CERVICOVAGINAL ANCILLARY ONLY
Chlamydia: NEGATIVE
Comment: NEGATIVE
Comment: NORMAL
Neisseria Gonorrhea: NEGATIVE

## 2019-04-13 LAB — STREP GP B NAA: Strep Gp B NAA: POSITIVE — AB

## 2019-04-13 NOTE — L&D Delivery Note (Addendum)
OB/GYN Faculty Practice Delivery Note  Tara Mathews is a 29 y.o. G1P0 at [redacted]w[redacted]d. She was admitted for IOL d/t PEC.   ROM: 19h 52m with thick mec fluid GBS Status: Positive, adequate ppx Maximum Maternal Temperature: 100.6*F  Labor Progress: . Patient presented to MAU for labor check and found to have elevated BP's, ultimately Pre-E without severe features. Initial SVE 0.5/thick/-3. Patient received cytotec x1, FB, pitocin, AROM, and progressed to complete.  Delivery Date/Time: 05/08/2019 @1655  Delivery: Called to room and patient had an anterior cervical lip that was reduced with pushing. Patient pushed for approx 1.5 hours. Head delivered direct OA and restituted to direct OP. No nuchal cord present. Shoulder dystocia was identified, lasted 32 seconds, and was resolved with the following maneuvers: McRoberts, suprapubic pressure, and delivery of posterior arm. Infant without spontaneous cry, poor tone, consequently cord clamped x 2 and cut immediately. Infant was handed to waiting NICU team at warmer who began resuscitation. Arterial cord gas collected. Placenta delivered spontaneously with gentle cord traction. Fundus firm with massage and Pitocin. Labia, perineum, vagina, and cervix inspected with 1st degree vaginal laceration and a right labial laceration, repaired by 2-0 and 4-0 Vicryl respectively in a standard fashion.  Placenta: 3 vessel, intact, to L&D Complications: 32 sec shoulder dystocia Lacerations: right labial, 1st degree perineal EBL: 180 mL Analgesia: epidural  Postpartum Planning [x]  message sent to schedule follow-up  [x]  vaccines UTD  Infant: female; 3235g APGAR (1 MIN): 4   APGAR (5 MINS): 9   APGAR (10 MINS):    Arterial pH: 7.293  Gladys Damme, MD [place], PGY-1 05/08/2019, 5:31 PM  I saw and evaluated the patient. I agree with the findings and the plan of care as documented in the resident's note.  Barrington Ellison, MD [facility] Family  Medicine Fellow, [facility] for [employer], [place]

## 2019-04-25 ENCOUNTER — Encounter: Payer: Self-pay | Admitting: Obstetrics

## 2019-04-25 ENCOUNTER — Telehealth (INDEPENDENT_AMBULATORY_CARE_PROVIDER_SITE_OTHER): Payer: Self-pay | Admitting: Obstetrics

## 2019-04-25 VITALS — BP 132/96 | HR 88

## 2019-04-25 DIAGNOSIS — Z3A38 38 weeks gestation of pregnancy: Secondary | ICD-10-CM

## 2019-04-25 DIAGNOSIS — O99213 Obesity complicating pregnancy, third trimester: Secondary | ICD-10-CM

## 2019-04-25 DIAGNOSIS — O99210 Obesity complicating pregnancy, unspecified trimester: Secondary | ICD-10-CM

## 2019-04-25 DIAGNOSIS — Z3490 Encounter for supervision of normal pregnancy, unspecified, unspecified trimester: Secondary | ICD-10-CM

## 2019-04-25 MED ORDER — PRENATAL 27-1 MG PO TABS: 1.0000 | ORAL_TABLET | Freq: Every day | ORAL | 11 refills | Status: DC

## 2019-04-25 NOTE — Progress Notes (Signed)
   TELEHEALTH OBSTETRICS PRENATAL VIRTUAL VIDEO VISIT ENCOUNTER NOTE  Provider location: [facility] [place]   I connected with Tara Mathews on 04/24/26 at 10:00 AM EST by OB MyChart Video Encounter at home and verified that I am speaking with the correct person using two identifiers.   I discussed the limitations, risks, security and privacy concerns of performing an evaluation and management service virtually and the availability of in person appointments. I also discussed with the patient that there may be a patient responsible charge related to this service. The patient expressed understanding and agreed to proceed. Subjective:  Tara Mathews is a 29 y.o. G1P0 at [redacted]w[redacted]d being seen today for ongoing prenatal care.  She is currently monitored for the following issues for this low-risk pregnancy and has Encounter for supervision of normal pregnancy and Obesity affecting pregnancy, antepartum on their problem list.  Patient reports no complaints.  Contractions: Not present. Vag. Bleeding: None, Bloody Show.  Movement: Present. Denies any leaking of fluid.   The following portions of the patient's history were reviewed and updated as appropriate: allergies, current medications, past family history, past medical history, past social history, past surgical history and problem list.   Objective:   Vitals:   04/25/19 0852  BP: (!) 132/96  Pulse: 88    Fetal Status:     Movement: Present     General:  Alert, oriented and cooperative. Patient is in no acute distress.  Respiratory: Normal respiratory effort, no problems with respiration noted  Mental Status: Normal mood and affect. Normal behavior. Normal judgment and thought content.  Rest of physical exam deferred due to type of encounter  Imaging: No results found.  Assessment and Plan:  Pregnancy: G1P0 at [redacted]w[redacted]d 1. Encounter for supervision of normal pregnancy, antepartum, unspecified  gravidity Rx: - Prenatal 27-1 MG TABS; Take 1 tablet by mouth daily.  Dispense: 30 tablet; Refill: 11  2. Obesity affecting pregnancy, antepartum   Term labor symptoms and general obstetric precautions including but not limited to vaginal bleeding, contractions, leaking of fluid and fetal movement were reviewed in detail with the patient. I discussed the assessment and treatment plan with the patient. The patient was provided an opportunity to ask questions and all were answered. The patient agreed with the plan and demonstrated an understanding of the instructions. The patient was advised to call back or seek an in-person office evaluation/go to MAU at [facility] for any urgent or concerning symptoms. Please refer to After Visit Summary for other counseling recommendations.   I provided 10 minutes of face-to-face time during this encounter.  Return in about 1 week (around 05/02/2019) for ROB.  Future Appointments  Date Time Provider [place]  04/25/2019 10:00 AM Shelly Bombard, MD [place] None    [name], [place] for [facility] [place], [place] [facility] 04/25/2019

## 2019-05-02 ENCOUNTER — Encounter (HOSPITAL_COMMUNITY): Payer: Self-pay | Admitting: *Deleted

## 2019-05-02 ENCOUNTER — Telehealth: Payer: Self-pay | Admitting: *Deleted

## 2019-05-02 ENCOUNTER — Ambulatory Visit (INDEPENDENT_AMBULATORY_CARE_PROVIDER_SITE_OTHER): Payer: Self-pay | Admitting: Family Medicine

## 2019-05-02 ENCOUNTER — Telehealth (HOSPITAL_COMMUNITY): Payer: Self-pay | Admitting: *Deleted

## 2019-05-02 ENCOUNTER — Other Ambulatory Visit: Payer: Self-pay

## 2019-05-02 VITALS — BP 116/78 | HR 86 | Wt 210.0 lb

## 2019-05-02 DIAGNOSIS — Z3400 Encounter for supervision of normal first pregnancy, unspecified trimester: Secondary | ICD-10-CM

## 2019-05-02 DIAGNOSIS — Z3A39 39 weeks gestation of pregnancy: Secondary | ICD-10-CM

## 2019-05-02 DIAGNOSIS — O99213 Obesity complicating pregnancy, third trimester: Secondary | ICD-10-CM

## 2019-05-02 DIAGNOSIS — B951 Streptococcus, group B, as the cause of diseases classified elsewhere: Secondary | ICD-10-CM | POA: Insufficient documentation

## 2019-05-02 DIAGNOSIS — O99210 Obesity complicating pregnancy, unspecified trimester: Secondary | ICD-10-CM

## 2019-05-02 NOTE — Progress Notes (Signed)
   PRENATAL VISIT NOTE  Subjective:  Tara Mathews is a 29 y.o. G1P0 at [redacted]w[redacted]d being seen today for ongoing prenatal care.  She is currently monitored for the following issues for this low-risk pregnancy and has Encounter for supervision of normal pregnancy; Obesity affecting pregnancy, antepartum; and Positive GBS test on their problem list.  Patient reports no complaints.  Contractions: Not present. Vag. Bleeding: None.  Movement: Present. Denies leaking of fluid.   The following portions of the patient's history were reviewed and updated as appropriate: allergies, current medications, past family history, past medical history, past social history, past surgical history and problem list.   Objective:   Vitals:   05/02/19 1000  BP: 116/78  Pulse: 86  Weight: 210 lb (95.3 kg)    Fetal Status: Fetal Heart Rate (bpm): 140   Movement: Present  Presentation: Vertex  General:  Alert, oriented and cooperative. Patient is in no acute distress.  Skin: Skin is warm and dry. No rash noted.   Cardiovascular: Normal heart rate noted  Respiratory: Normal respiratory effort, no problems with respiration noted  Abdomen: Soft, gravid, appropriate for gestational age.  Pain/Pressure: Present     Pelvic: Cervical exam performed Dilation: Fingertip Effacement (%): 50 Station: -3  Extremities: Normal range of motion.  Edema: Trace  Mental Status: Normal mood and affect. Normal behavior. Normal judgment and thought content.   Assessment and Plan:  Pregnancy: G1P0 at [redacted]w[redacted]d 1. Supervision of normal first pregnancy, antepartum - RTC in 1 week; IOL scheduled today for 41w - If patient still pregnant next week, will need antenatal testing between 40-41w  2. Obesity affecting pregnancy, antepartum Wt Readings from Last 4 Encounters:  05/02/19 210 lb (95.3 kg)  04/11/19 206 lb (93.4 kg)  03/28/19 202 lb (91.6 kg)  02/07/19 198 lb (89.8 kg)   3. GBS positive - Needs intrapartum ppx   Term  labor symptoms and general obstetric precautions including but not limited to vaginal bleeding, contractions, leaking of fluid and fetal movement were reviewed in detail with the patient. Please refer to After Visit Summary for other counseling recommendations.   Return in about 1 week (around 05/09/2019) for ROB; in-person.  No future appointments.  Chauncey Mann, MD

## 2019-05-02 NOTE — Progress Notes (Signed)
ROB.  C/o pelvic pain/pressure, PNV makes her heartburn worse.

## 2019-05-02 NOTE — Telephone Encounter (Signed)
Pt called to office and wants know if it is normal to have dark d/c after her cervical exam today.  Pt made aware that she may have some spotting or pink tinge d/c after an exam that is normal. Pt advised that if she were to have any large amount or bright red bleeding to be seen at the hospital.  Pt states understanding.

## 2019-05-02 NOTE — Telephone Encounter (Signed)
Preadmission screen  

## 2019-05-02 NOTE — Addendum Note (Signed)
Addended by: Barrington Ellison on: 05/02/2019 10:59 AM   Modules accepted: Orders, SmartSet

## 2019-05-07 ENCOUNTER — Other Ambulatory Visit: Payer: Self-pay

## 2019-05-07 ENCOUNTER — Encounter (HOSPITAL_COMMUNITY): Payer: Self-pay | Admitting: Obstetrics & Gynecology

## 2019-05-07 ENCOUNTER — Inpatient Hospital Stay (HOSPITAL_COMMUNITY)
Admission: AD | Admit: 2019-05-07 | Discharge: 2019-05-10 | DRG: 807 | Disposition: A | Discharge status: Home / Self Care | Payer: Medicaid Other | Attending: Family Medicine | Admitting: Family Medicine

## 2019-05-07 DIAGNOSIS — Z20822 Contact with and (suspected) exposure to covid-19: Secondary | ICD-10-CM | POA: Diagnosis present

## 2019-05-07 DIAGNOSIS — B951 Streptococcus, group B, as the cause of diseases classified elsewhere: Secondary | ICD-10-CM | POA: Diagnosis present

## 2019-05-07 DIAGNOSIS — O1404 Mild to moderate pre-eclampsia, complicating childbirth: Secondary | ICD-10-CM | POA: Diagnosis present

## 2019-05-07 DIAGNOSIS — O9962 Diseases of the digestive system complicating childbirth: Secondary | ICD-10-CM | POA: Diagnosis present

## 2019-05-07 DIAGNOSIS — O321XX0 Maternal care for breech presentation, not applicable or unspecified: Secondary | ICD-10-CM | POA: Diagnosis present

## 2019-05-07 DIAGNOSIS — K219 Gastro-esophageal reflux disease without esophagitis: Secondary | ICD-10-CM | POA: Diagnosis present

## 2019-05-07 DIAGNOSIS — O99210 Obesity complicating pregnancy, unspecified trimester: Secondary | ICD-10-CM | POA: Diagnosis present

## 2019-05-07 DIAGNOSIS — O99824 Streptococcus B carrier state complicating childbirth: Secondary | ICD-10-CM | POA: Diagnosis present

## 2019-05-07 DIAGNOSIS — O99214 Obesity complicating childbirth: Secondary | ICD-10-CM | POA: Diagnosis present

## 2019-05-07 DIAGNOSIS — Z3A39 39 weeks gestation of pregnancy: Secondary | ICD-10-CM | POA: Diagnosis not present

## 2019-05-07 DIAGNOSIS — R030 Elevated blood-pressure reading, without diagnosis of hypertension: Secondary | ICD-10-CM | POA: Diagnosis present

## 2019-05-07 DIAGNOSIS — O1490 Unspecified pre-eclampsia, unspecified trimester: Secondary | ICD-10-CM | POA: Insufficient documentation

## 2019-05-07 DIAGNOSIS — E669 Obesity, unspecified: Secondary | ICD-10-CM | POA: Diagnosis present

## 2019-05-07 LAB — ABO/RH: ABO/RH(D): O POS

## 2019-05-07 LAB — CBC
HCT: 39.7 % (ref 36.0–46.0)
Hemoglobin: 13 g/dL (ref 12.0–15.0)
MCH: 27.9 pg (ref 26.0–34.0)
MCHC: 32.7 g/dL (ref 30.0–36.0)
MCV: 85.2 fL (ref 80.0–100.0)
Platelets: 294 10*3/uL (ref 150–400)
RBC: 4.66 MIL/uL (ref 3.87–5.11)
RDW: 14.9 % (ref 11.5–15.5)
WBC: 9.2 10*3/uL (ref 4.0–10.5)
nRBC: 0 % (ref 0.0–0.2)

## 2019-05-07 LAB — COMPREHENSIVE METABOLIC PANEL
ALT: 25 U/L (ref 0–44)
AST: 21 U/L (ref 15–41)
Albumin: 2.7 g/dL — ABNORMAL LOW (ref 3.5–5.0)
Alkaline Phosphatase: 110 U/L (ref 38–126)
Anion gap: 12 (ref 5–15)
BUN: 12 mg/dL (ref 6–20)
CO2: 18 mmol/L — ABNORMAL LOW (ref 22–32)
Calcium: 9.2 mg/dL (ref 8.9–10.3)
Chloride: 107 mmol/L (ref 98–111)
Creatinine, Ser: 0.51 mg/dL (ref 0.44–1.00)
GFR calc Af Amer: >60 mL/min (ref >60)
GFR calc non Af Amer: >60 mL/min (ref >60)
Glucose, Bld: 92 mg/dL (ref 70–99)
Potassium: 3.9 mmol/L (ref 3.5–5.1)
Sodium: 137 mmol/L (ref 135–145)
Total Bilirubin: 0.5 mg/dL (ref 0.3–1.2)
Total Protein: 6.6 g/dL (ref 6.5–8.1)

## 2019-05-07 LAB — TYPE AND SCREEN
ABO/RH(D): O POS
Antibody Screen: NEGATIVE

## 2019-05-07 LAB — RPR: RPR Ser Ql: NONREACTIVE

## 2019-05-07 LAB — PROTEIN / CREATININE RATIO, URINE
Creatinine, Urine: 47.08 mg/dL
Protein Creatinine Ratio: 0.66 mg/mg{Cre} — ABNORMAL HIGH (ref 0.00–0.15)
Total Protein, Urine: 31 mg/dL

## 2019-05-07 LAB — SARS CORONAVIRUS 2 (TAT 6-24 HRS): SARS Coronavirus 2: NEGATIVE

## 2019-05-07 MED ORDER — LIDOCAINE HCL (PF) 1 % IJ SOLN
30.0000 mL | INTRAMUSCULAR | Status: AC | PRN
  Administered 2019-05-08: 3 mL via SUBCUTANEOUS
  Administered 2019-05-08: 2 mL via SUBCUTANEOUS
  Administered 2019-05-08: 5 mL via SUBCUTANEOUS

## 2019-05-07 MED ORDER — SODIUM CHLORIDE 0.9 % IV SOLN
5.0000 10*6.[IU] | Freq: Once | INTRAVENOUS | Status: AC
  Administered 2019-05-07: 09:00:00 5 10*6.[IU] via INTRAVENOUS
  Filled 2019-05-07: qty 5

## 2019-05-07 MED ORDER — TERBUTALINE SULFATE 1 MG/ML IJ SOLN: 0.2500 mg | Freq: Once | INTRAMUSCULAR | Status: DC | PRN

## 2019-05-07 MED ORDER — TERBUTALINE SULFATE 1 MG/ML IJ SOLN
0.2500 mg | Freq: Once | INTRAMUSCULAR | Status: DC | PRN
  Filled 2019-05-07: qty 1

## 2019-05-07 MED ORDER — LACTATED RINGERS IV SOLN
500.0000 mL | INTRAVENOUS | Status: DC | PRN
  Administered 2019-05-07 (×2): 500 mL via INTRAVENOUS
  Administered 2019-05-08: 1000 mL via INTRAVENOUS

## 2019-05-07 MED ORDER — PENICILLIN G POT IN DEXTROSE 60000 UNIT/ML IV SOLN
3.0000 10*6.[IU] | INTRAVENOUS | Status: DC
  Administered 2019-05-07 – 2019-05-08 (×7): 3 10*6.[IU] via INTRAVENOUS
  Filled 2019-05-07 (×7): qty 50

## 2019-05-07 MED ORDER — LACTATED RINGERS IV SOLN: INTRAVENOUS | Status: DC

## 2019-05-07 MED ORDER — OXYTOCIN 40 UNITS IN NORMAL SALINE INFUSION - SIMPLE MED
1.0000 m[IU]/min | INTRAVENOUS | Status: DC
  Administered 2019-05-07: 13:00:00 2 m[IU]/min via INTRAVENOUS
  Administered 2019-05-08: 12 m[IU]/min via INTRAVENOUS

## 2019-05-07 MED ORDER — ONDANSETRON HCL 4 MG/2ML IJ SOLN: 4.0000 mg | Freq: Four times a day (QID) | INTRAMUSCULAR | Status: DC | PRN

## 2019-05-07 MED ORDER — MISOPROSTOL 50MCG HALF TABLET
50.0000 ug | ORAL_TABLET | ORAL | Status: DC | PRN
  Administered 2019-05-07: 08:00:00 50 ug via BUCCAL
  Filled 2019-05-07: qty 1

## 2019-05-07 MED ORDER — OXYTOCIN BOLUS FROM INFUSION
500.0000 mL | Freq: Once | INTRAVENOUS | Status: AC
  Administered 2019-05-08: 17:00:00 500 mL via INTRAVENOUS

## 2019-05-07 MED ORDER — FENTANYL CITRATE (PF) 100 MCG/2ML IJ SOLN: 50.0000 ug | INTRAMUSCULAR | Status: DC | PRN

## 2019-05-07 MED ORDER — SOD CITRATE-CITRIC ACID 500-334 MG/5ML PO SOLN: 30.0000 mL | ORAL | Status: DC | PRN

## 2019-05-07 MED ORDER — ACETAMINOPHEN 325 MG PO TABS: 650.0000 mg | ORAL_TABLET | ORAL | Status: DC | PRN

## 2019-05-07 MED ORDER — OXYTOCIN 40 UNITS IN NORMAL SALINE INFUSION - SIMPLE MED
2.5000 [IU]/h | INTRAVENOUS | Status: DC
  Filled 2019-05-07 (×2): qty 1000

## 2019-05-07 NOTE — Progress Notes (Addendum)
Labor Progress Note Tara Mathews is a 29 y.o. G1P0 at [redacted]w[redacted]d presented for IOL due to Pre-E S: Feeling ctx slowly increasing   O:  BP 133/75   Pulse 60   Temp 98.2 F (36.8 C) (Oral)   Resp 16   Ht 4\' 11"  (1.499 m)   Wt 94.8 kg   LMP 08/02/2018 (Approximate)   SpO2 98%   BMI 42.21 kg/m  EFM: FHR 140-150/moderate variability/accellerations absent/decelerations absent  CVE: Dilation: 5.5 Effacement (%): 50 Cervical Position: Posterior Station: -2 Presentation: Vertex Exam by:: Dr.Kamilo Och   A&P: 29 y.o. G1P0 [redacted]w[redacted]d for IOL due to Pre-E. #Labor: No change from previous exam. S/p FB and Cytotec x1 and Pitocin. Consider AROM if unchanged at next exam. #Pain: Per patient request #FWB: Cat 1 #GBS positive, PCN #Pre-E: Mild range BP's. Pr/Cr 0.66. CMP WNL. Serial BP's. Asymptomatic. Expectant management.  Hettie Holstein, Medical Student 6:02 PM  I saw and evaluated the patient. I agree with the findings and the plan of care as documented in the student's note. Cervix still thick. Consider AROM when better ctx pattern.   Barrington Ellison, MD [facility] Family Medicine Fellow, [facility] for [employer], [place]

## 2019-05-07 NOTE — Progress Notes (Signed)
LABOR PROGRESS NOTE  Tara Mathews is a 29 y.o. G1P0 at [redacted]w[redacted]d  admitted for IOL due to PreE  Subjective: Patient is comfortable, with intermittent discomfort associated w/ contractions. Patient currently most comfortable sitting up on balloon chair.  Objective: BP (!) 151/100   Pulse 79   Temp 98.2 F (36.8 C) (Oral)   Resp 20   Ht 4\' 11"  (1.499 m)   Wt 94.8 kg   LMP 08/02/2018 (Approximate)   SpO2 98%   BMI 42.21 kg/m   Dilation: 5 Effacement (%): 80 Cervical Position: Posterior Station: 0 Presentation: Vertex Exam by:: Dr. Dione Plover Fetal monitoring: Baseline: 150 bpm, Variability: Good {> 6 bpm), Accelerations: Reactive and Decelerations: Variable: moderate Uterine activity: Frequency: Every 2-3 minutes, Duration: ~40-60 seconds and Intensity: moderate  Labs: Lab Results  Component Value Date   WBC 9.2 05/07/2019   HGB 13.0 05/07/2019   HCT 39.7 05/07/2019   MCV 85.2 05/07/2019   PLT 294 05/07/2019    Patient Active Problem List   Diagnosis Date Noted  . Labor and delivery, indication for care 05/07/2019  . Preeclampsia 05/07/2019  . Positive GBS test 05/02/2019  . Obesity affecting pregnancy, antepartum 10/18/2018  . Encounter for supervision of normal pregnancy 10/17/2018    Assessment / Plan: Induction of labor due to preeclampsia,  progressing well  #Labor: Progressing on Pitocin, s/p AROM w/ thick meconium. S/p Cytotec x1. S/p FB. Pitocin @ 15 mu/min #Fetal Wellbeing:  Category II #Pain Control: Labor support without medications #ID: GBS positive, PCN #Anticipated MOD: NSVD   Bud Face, DO, PGY-1 Family Medicine Resident, [facility] Faculty Teaching Service  05/07/2019, 9:58 PM

## 2019-05-07 NOTE — MAU Provider Note (Signed)
OBSTETRIC ADMISSION HISTORY AND PHYSICAL  Rowyn Herma Ard is a 29 y.o. female G1P0 with IUP at 9w5dby LMP presenting for IOL for GHTN. She reports +FMs, No LOF, no VB, no blurry vision, headaches or peripheral edema, and RUQ pain.  She plans on breast feeding. She request POP for birth control. She received her prenatal care at [facility]   Dating: By LMP --->  Estimated Date of Delivery: 05/09/19   Nursing Staff Provider  Office Location  [facility] Dating  LMP  Language   English Anatomy [place] - normal - incomplete 12/22  Flu Vaccine   05/24/2018 Genetic Screen  NIPS:low risk female   AFP: negative     TDaP vaccine   letter given for [facility] Hgb A1C or  GTT Early  Third trimester 75-110-102  Rhogam     LAB RESULTS   Feeding Plan  Breastfeed Blood Type O/Positive/-- (07/08 1615)   Contraception  POPs  Antibody Negative (07/08 1615)  Circumcision  No Rubella 12.60 (07/08 1615) Immune  Pediatrician   list given 12/13/18 RPR Non Reactive (10/28 0936)   Support Person  mother HBsAg Negative (07/08 1615)   Prenatal Classes  Undecided HIV Non Reactive (10/28 0936)  BTL Consent  GBS  POSITIVE  VBAC Consent  Pap Cytology negative, pos HR HPV, routine screening 3 yers    Hgb Electro   negative  BP Cuff  faxed 10/18/2018 CF  negative    SMA  negative    Waterbirth  _0  Class _1  Consent _2  CNM visit   Prenatal History/Complications:  Past Medical History: Past Medical History:  Diagnosis Date  . Anxiety     Past Surgical History: Past Surgical History:  Procedure Laterality Date  . NO PAST SURGERIES      Obstetrical History: OB History    Gravida  1   Para      Term      Preterm      AB      Living        SAB      TAB      Ectopic      Multiple      Live Births              Social History Social History   Socioeconomic History  . Marital status: Single    Spouse name: Not on file  . Number of children: Not on file  . Years of education: Not on  file  . Highest education level: Not on file  Occupational History  . Occupation: UNEMPLOYED  Tobacco Use  . Smoking status: Never Smoker  . Smokeless tobacco: Never Used  Substance and Sexual Activity  . Alcohol use: No  . Drug use: No  . Sexual activity: Yes    Birth control/protection: None  Other Topics Concern  . Not on file  Social History Narrative  . Not on file   Social Determinants of Health   Financial Resource Strain:   . Difficulty of Paying Living Expenses: Not on file  Food Insecurity:   . Worried About RCharity fundraiserin the Last Year: Not on file  . Ran Out of Food in the Last Year: Not on file  Transportation Needs:   . Lack of Transportation (Medical): Not on file  . Lack of Transportation (Non-Medical): Not on file  Physical Activity:   . Days of Exercise per Week: Not on file  . Minutes of Exercise  per Session: Not on file  Stress:   . Feeling of Stress : Not on file  Social Connections:   . Frequency of Communication with Friends and Family: Not on file  . Frequency of Social Gatherings with Friends and Family: Not on file  . Attends Religious Services: Not on file  . Active Member of Clubs or Organizations: Not on file  . Attends Archivist Meetings: Not on file  . Marital Status: Not on file    Family History: Family History  Problem Relation Age of Onset  . Cancer Maternal Aunt   . Miscarriages / Stillbirths Maternal Aunt   . Cancer Maternal Uncle   . Diabetes Paternal Grandmother   . Obesity Mother   . Arthritis Father   . Hypertension Father   . Obesity Father     Allergies: Allergies  Allergen Reactions  . Bactrim [Sulfamethoxazole-Trimethoprim]     Medications Prior to Admission  Medication Sig Dispense Refill Last Dose  . Prenatal 27-1 MG TABS Take 1 tablet by mouth daily. 30 tablet 11 05/06/2019 at 7pm  . aspirin EC 81 MG tablet Take 1 tablet (81 mg total) by mouth daily. (Patient not taking: Reported on  03/15/2019) 100 tablet 2   . Blood Pressure Monitor KIT 1 kit by Does not apply route once a week. CHECK BP WEEKLY.  LARGE CUFF  DX:  Z13.6         Z34.86 1 kit 0   . cephALEXin (KEFLEX) 500 MG capsule Take 1 capsule (500 mg total) by mouth 4 (four) times daily. (Patient not taking: Reported on 12/13/2018) 28 capsule 2   . omeprazole (PRILOSEC) 20 MG capsule Take 1 capsule (20 mg total) by mouth daily. (Patient not taking: Reported on 04/11/2019) 30 capsule 5   . valACYclovir (VALTREX) 1000 MG tablet Take 2 tablets po bid x 1 day for cold sores (Patient not taking: Reported on 04/11/2019) 30 tablet 5      Review of Systems   All systems reviewed and negative except as stated in HPI  Blood pressure 112/64, pulse 75, temperature 98.4 F (36.9 C), last menstrual period 08/02/2018. General appearance: alert, cooperative and no distress Lungs: clear to auscultation bilaterally Heart: regular rate and rhythm Abdomen: soft, non-tender; bowel sounds normal Pelvic: n/a Extremities: Homans sign is negative, no sign of DVT DTR's +2 Presentation: cephalic Fetal monitoringBaseline: 130 bpm, Variability: Good {> 6 bpm), Accelerations: Reactive and Decelerations: Absent Uterine activityNone     Prenatal labs: ABO, Rh: O/Positive/-- (07/08 1615) Antibody: Negative (07/08 1615) Rubella: 12.60 (07/08 1615) RPR: Non Reactive (10/28 0936)  HBsAg: Negative (07/08 1615)  HIV: Non Reactive (10/28 0936)  GBS: --Tessie Fass (12/30 6761)    Prenatal Transfer Tool  Maternal Diabetes: No Genetic Screening: Normal Maternal Ultrasounds/Referrals: Normal Fetal Ultrasounds or other Referrals:  None Maternal Substance Abuse:  No Significant Maternal Medications:  None Significant Maternal Lab Results: Group B Strep positive  No results found for this or any previous visit (from the past 24 hour(s)).  Patient Active Problem List   Diagnosis Date Noted  . Labor and delivery, indication for care  05/07/2019  . Positive GBS test 05/02/2019  . Obesity affecting pregnancy, antepartum 10/18/2018  . Encounter for supervision of normal pregnancy 10/17/2018    Assessment/Plan:  Florean Hoobler is a 29 y.o. G1P0 at 16w5dhere for IOL for gHTN  #Labor: cytotec and FB when able #Pain: Per patient request #FWB: Cat 1 #ID:  GBS pos, PCN #  MOF: Breast #MOC: POPs #Circ:  no  Wende Mott, CNM  05/07/2019, 6:35 AM

## 2019-05-07 NOTE — MAU Note (Signed)
Pt reports to MAU stating she had three gushes of fluid since midnight, with the fluid having a red tinge. +fetal movement, had had some VB since having membranes swept.

## 2019-05-07 NOTE — H&P (Signed)
OBSTETRIC ADMISSION HISTORY AND PHYSICAL  Tara Mathews is a 29 y.o. female G1P0 with IUP at 56w5dby LMP presenting for bloody mucous and found to have elevated pressures. She reports +Fms,  no blurry vision, headaches or peripheral edema, and RUQ pain.  She plans on breast feeding. She requests POPs for birth control. [facility] --->  Estimated Date of Delivery: 05/09/19  Sono: 12/14/2018  @[redacted]w[redacted]d , CWD, normal anatomy, breech presentation, anterior placental lie, 241g, 13.8% EFW Anatomy unable to be completed; recommended f/u  Prenatal History/Complications: Pre-E diagnosed on admission F/u for complete anatomy scan not done Maternal obesity; BMI 42 GBS positive  Past Medical History: Past Medical History:  Diagnosis Date  . Anxiety     Past Surgical History: Past Surgical History:  Procedure Laterality Date  . NO PAST SURGERIES      Obstetrical History: OB History    Gravida  1   Para      Term      Preterm      AB      Living        SAB      TAB      Ectopic      Multiple      Live Births              Social History: Social History   Socioeconomic History  . Marital status: Single    Spouse name: Not on file  . Number of children: Not on file  . Years of education: Not on file  . Highest education level: Not on file  Occupational History  . Occupation: UNEMPLOYED  Tobacco Use  . Smoking status: Never Smoker  . Smokeless tobacco: Never Used  Substance and Sexual Activity  . Alcohol use: No  . Drug use: No  . Sexual activity: Yes    Birth control/protection: None  Other Topics Concern  . Not on file  Social History Narrative  . Not on file   Social Determinants of Health   Financial Resource Strain:   . Difficulty of Paying Living Expenses: Not on file  Food Insecurity:   . Worried About RCharity fundraiserin the Last Year: Not on file  . Ran Out of Food in the Last Year:  Not on file  Transportation Needs:   . Lack of Transportation (Medical): Not on file  . Lack of Transportation (Non-Medical): Not on file  Physical Activity:   . Days of Exercise per Week: Not on file  . Minutes of Exercise per Session: Not on file  Stress:   . Feeling of Stress : Not on file  Social Connections:   . Frequency of Communication with Friends and Family: Not on file  . Frequency of Social Gatherings with Friends and Family: Not on file  . Attends Religious Services: Not on file  . Active Member of Clubs or Organizations: Not on file  . Attends CArchivistMeetings: Not on file  . Marital Status: Not on file    Family History: Family History  Problem Relation Age of Onset  . Cancer Maternal Aunt   . Miscarriages / Stillbirths Maternal Aunt   . Cancer Maternal Uncle   . Diabetes Paternal Grandmother   . Obesity Mother   . Arthritis Father   . Hypertension Father   . Obesity Father     Allergies: Allergies  Allergen Reactions  . Bactrim [Sulfamethoxazole-Trimethoprim]  Medications Prior to Admission  Medication Sig Dispense Refill Last Dose  . Prenatal 27-1 MG TABS Take 1 tablet by mouth daily. 30 tablet 11 05/06/2019 at 7pm  . aspirin EC 81 MG tablet Take 1 tablet (81 mg total) by mouth daily. (Patient not taking: Reported on 03/15/2019) 100 tablet 2   . Blood Pressure Monitor KIT 1 kit by Does not apply route once a week. CHECK BP WEEKLY.  LARGE CUFF  DX:  Z13.6         Z34.86 1 kit 0   . cephALEXin (KEFLEX) 500 MG capsule Take 1 capsule (500 mg total) by mouth 4 (four) times daily. (Patient not taking: Reported on 12/13/2018) 28 capsule 2   . omeprazole (PRILOSEC) 20 MG capsule Take 1 capsule (20 mg total) by mouth daily. (Patient not taking: Reported on 04/11/2019) 30 capsule 5   . valACYclovir (VALTREX) 1000 MG tablet Take 2 tablets po bid x 1 day for cold sores (Patient not taking: Reported on 04/11/2019) 30 tablet 5      Review of Systems    All systems reviewed and negative except as stated in HPI  Blood pressure 112/64, pulse 75, temperature 98.2 F (36.8 C), temperature source Oral, height 4' 11"  (1.499 m), weight 94.8 kg, last menstrual period 08/02/2018. General appearance: alert, cooperative and appears stated age Lungs: normal effort Heart: regular rate  Abdomen: soft, non-tender; bowel sounds normal Pelvic: gravid uterus GU: No vaginal lesions  Extremities: Homans sign is negative, no sign of DVT Presentation: cephalic by [place] on admission Fetal monitoringBaseline: 135 bpm, Variability: Good {> 6 bpm), Accelerations: Reactive and Decelerations: Absent Uterine activity: None Dilation: Fingertip Effacement (%): Thick Station: -3 Exam by:: Dr. Marice Potter   Prenatal labs: ABO, Rh: --/--/O POS, O POS Performed at [facility], [place] [address] [place], [place] [address]  [phone]) Antibody: NEG (01/25 7824) Rubella: 12.60 (07/08 1615) RPR: Non Reactive (10/28 0936)  HBsAg: Negative (07/08 1615)  HIV: Non Reactive (10/28 0936)  GBS: --Tessie Fass (12/30 0946)  2 hr Glucola WNL Genetic screening  WNL Anatomy US incomplete as above but grossly normal  Prenatal Transfer Tool  Maternal Diabetes: No Genetic Screening: Normal Maternal Ultrasounds/Referrals: Normal Fetal Ultrasounds or other Referrals:  None Maternal Substance Abuse:  No Significant Maternal Medications:  None Significant Maternal Lab Results: Group B Strep positive  Results for orders placed or performed during the hospital encounter of 05/07/19 (from the past 24 hour(s))  CBC   Collection Time: 05/07/19  6:50 AM  Result Value Ref Range   WBC 9.2 4.0 - 10.5 K/uL   RBC 4.66 3.87 - 5.11 MIL/uL   Hemoglobin 13.0 12.0 - 15.0 g/dL   HCT 39.7 36.0 - 46.0 %   MCV 85.2 80.0 - 100.0 fL   MCH 27.9 26.0 - 34.0 pg   MCHC 32.7 30.0 - 36.0 g/dL   RDW 14.9 11.5 - 15.5 %   Platelets 294 150 - 400 K/uL   nRBC 0.0 0.0 - 0.2 %  Comprehensive metabolic  panel   Collection Time: 05/07/19  6:50 AM  Result Value Ref Range   Sodium 137 135 - 145 mmol/L   Potassium 3.9 3.5 - 5.1 mmol/L   Chloride 107 98 - 111 mmol/L   CO2 18 (L) 22 - 32 mmol/L   Glucose, Bld 92 70 - 99 mg/dL   BUN 12 6 - 20 mg/dL   Creatinine, Ser 0.51 0.44 - 1.00 mg/dL   Calcium 9.2  8.9 - 10.3 mg/dL   Total Protein 6.6 6.5 - 8.1 g/dL   Albumin 2.7 (L) 3.5 - 5.0 g/dL   AST 21 15 - 41 U/L   ALT 25 0 - 44 U/L   Alkaline Phosphatase 110 38 - 126 U/L   Total Bilirubin 0.5 0.3 - 1.2 mg/dL   GFR calc non Af Amer >60 >60 mL/min   GFR calc Af Amer >60 >60 mL/min   Anion gap 12 5 - 15  Type and screen   Collection Time: 05/07/19  6:50 AM  Result Value Ref Range   ABO/RH(D) O POS    Antibody Screen NEG    Sample Expiration      05/10/2019,2359 Performed at [facility], [place] [address], [place], [place] [address]   ABO/Rh   Collection Time: 05/07/19  6:50 AM  Result Value Ref Range   ABO/RH(D)      O POS Performed at [place] [address], [place], [place] [address]   Protein / creatinine ratio, urine   Collection Time: 05/07/19  6:52 AM  Result Value Ref Range   Creatinine, Urine 47.08 mg/dL   Total Protein, Urine 31 mg/dL   Protein Creatinine Ratio 0.66 (H) 0.00 - 0.15 mg/mg[Cre]    Patient Active Problem List   Diagnosis Date Noted  . Labor and delivery, indication for care 05/07/2019  . Preeclampsia 05/07/2019  . Positive GBS test 05/02/2019  . Obesity affecting pregnancy, antepartum 10/18/2018  . Encounter for supervision of normal pregnancy 10/17/2018    Assessment/Plan:  Francile Woolford is a 29 y.o. G1P0 at 34w5dhere for IOL for Pre-E.  #Labor: Vertex by BSUS on admission. S/p Cytotec x1. FB placed. Pit/AROM as indicated. Anticipate SVD. #Pain: Per patient request #FWB: Cat I; EFW: 3200g #ID:  GBS pos; PCN ordered #MOF: Breast #MOC: POPs #Circ:  Decline #Pre-E: Mild range BP's. Pr/Cr 0.66. CMP WNL. Serial BP's.  Asymptomatic currently.   CBarrington Ellison MD ONew York Presbyterian Hospital - Westchester DivisionFamily Medicine Fellow, FThe Surgery And End[facility] LLCfor WBib[facility] 05/07/2019, 9:00 AM

## 2019-05-07 NOTE — Progress Notes (Addendum)
LABOR PROGRESS NOTE  Tara Mathews is a 29 y.o. G1P0 at [redacted]w[redacted]d  admitted for IOL due to PreE  Subjective: Patient is more uncomfortable. Positional changes are assisting in pain relief.  Objective: BP (!) 146/71   Pulse 84   Temp 98.7 F (37.1 C) (Oral)   Resp 20   Ht 4\' 11"  (1.499 m)   Wt 94.8 kg   LMP 08/02/2018 (Approximate)   SpO2 98%   BMI 42.21 kg/m   Dilation: 5.5 Effacement (%): 90 Cervical Position: Posterior Station: 0 Presentation: Vertex Exam by:: Dr. Dione Plover Fetal monitoring: Baseline: 150 bpm, Variability: Good {> 6 bpm), Accelerations: Reactive and Decelerations: Variable: moderate Uterine activity: Frequency: Every 2-3 minutes, Duration: ~40-60 seconds and Intensity: moderate  Labs: Lab Results  Component Value Date   WBC 9.2 05/07/2019   HGB 13.0 05/07/2019   HCT 39.7 05/07/2019   MCV 85.2 05/07/2019   PLT 294 05/07/2019    Patient Active Problem List   Diagnosis Date Noted  . Labor and delivery, indication for care 05/07/2019  . Preeclampsia 05/07/2019  . Positive GBS test 05/02/2019  . Obesity affecting pregnancy, antepartum 10/18/2018  . Encounter for supervision of normal pregnancy 10/17/2018    Assessment / Plan: Induction of labor due to preeclampsia,  progressing well  #Labor: Progressing on Pitocin, s/p AROM w/ thick meconium. S/p Cytotec x1. S/p FB. Pitocin @ 10 mu/min #PreE w/o SF: patient stable, SBP <160, DBP <110. Continue to monitor vitals and clinical status. #Fetal Wellbeing:  Category II #Pain Control: Labor support without medications per patient request #ID: GBS positive, PCN #Anticipated MOD: NSVD   Bud Face, DO, PGY-1 Family Medicine Resident, [facility] Faculty Teaching Service  05/07/2019, 11:38 PM

## 2019-05-07 NOTE — Progress Notes (Signed)
Labor Progress Note Tara Mathews is a 29 y.o. G1P0 at [redacted]w[redacted]d presented for bloody mucous and admitted for gHTN now Pre-E. S: Feeling frequent ctx.  O:  BP 133/82   Pulse 77   Temp 98.2 F (36.8 C) (Oral)   Resp 18   Ht 4\' 11"  (1.499 m)   Wt 94.8 kg   LMP 08/02/2018 (Approximate)   SpO2 98%   BMI 42.21 kg/m  EFM: 145, moderate variability, pos accels, no decels, reactive TOCO: q2-49m  CVE: Dilation: Fingertip Effacement (%): Thick Station: -3 Presentation: Vertex Exam by:: Dr. Marice Potter   A&P: 29 y.o. G1P0 [redacted]w[redacted]d here for IOL for Pre-E. #Labor: Progressing well. S/p FB and Cytotec x1. Will start Pitocin. AROM PRN. Anticipate SVD. #Pain: per patient request  #FWB: Cat I #GBS positive; PCN #Pre-E: Mild range BP's. Pr/Cr 0.66. CMP WNL. Serial BP's. Asymptomatic currently.  BP Readings from Last 4 Encounters:  05/07/19 133/82  05/02/19 116/78  04/25/19 (!) 132/96  04/11/19 128/77   Chauncey Mann, MD 1:02 PM

## 2019-05-08 ENCOUNTER — Inpatient Hospital Stay (HOSPITAL_COMMUNITY): Payer: Medicaid Other | Admitting: Anesthesiology

## 2019-05-08 ENCOUNTER — Encounter (HOSPITAL_COMMUNITY): Payer: Self-pay | Admitting: Obstetrics & Gynecology

## 2019-05-08 DIAGNOSIS — O1404 Mild to moderate pre-eclampsia, complicating childbirth: Secondary | ICD-10-CM

## 2019-05-08 DIAGNOSIS — O99824 Streptococcus B carrier state complicating childbirth: Secondary | ICD-10-CM

## 2019-05-08 DIAGNOSIS — Z3A39 39 weeks gestation of pregnancy: Secondary | ICD-10-CM

## 2019-05-08 LAB — CBC
HCT: 40 % (ref 36.0–46.0)
HCT: 35.3 % — ABNORMAL LOW (ref 36.0–46.0)
Hemoglobin: 12.9 g/dL (ref 12.0–15.0)
Hemoglobin: 11.7 g/dL — ABNORMAL LOW (ref 12.0–15.0)
MCH: 27.7 pg (ref 26.0–34.0)
MCH: 28.5 pg (ref 26.0–34.0)
MCHC: 32.3 g/dL (ref 30.0–36.0)
MCHC: 33.1 g/dL (ref 30.0–36.0)
MCV: 85.8 fL (ref 80.0–100.0)
MCV: 86.1 fL (ref 80.0–100.0)
Platelets: 335 10*3/uL (ref 150–400)
Platelets: 275 10*3/uL (ref 150–400)
RBC: 4.66 MIL/uL (ref 3.87–5.11)
RBC: 4.1 MIL/uL (ref 3.87–5.11)
RDW: 15.4 % (ref 11.5–15.5)
RDW: 15.5 % (ref 11.5–15.5)
WBC: 21.6 10*3/uL — ABNORMAL HIGH (ref 4.0–10.5)
WBC: 20.2 10*3/uL — ABNORMAL HIGH (ref 4.0–10.5)
nRBC: 0 % (ref 0.0–0.2)
nRBC: 0 % (ref 0.0–0.2)

## 2019-05-08 MED ORDER — FENTANYL-BUPIVACAINE-NACL 0.5-0.125-0.9 MG/250ML-% EP SOLN: 12.0000 mL/h | EPIDURAL | Status: DC | PRN

## 2019-05-08 MED ORDER — LACTATED RINGERS IV SOLN
500.0000 mL | Freq: Once | INTRAVENOUS | Status: AC
  Administered 2019-05-08: 10:00:00 1000 mL via INTRAVENOUS

## 2019-05-08 MED ORDER — ONDANSETRON HCL 4 MG/2ML IJ SOLN: 4.0000 mg | INTRAMUSCULAR | Status: DC | PRN

## 2019-05-08 MED ORDER — DIBUCAINE (PERIANAL) 1 % EX OINT: 1.0000 "application " | TOPICAL_OINTMENT | CUTANEOUS | Status: DC | PRN

## 2019-05-08 MED ORDER — PHENYLEPHRINE 40 MCG/ML (10ML) SYRINGE FOR IV PUSH (FOR BLOOD PRESSURE SUPPORT): 80.0000 ug | PREFILLED_SYRINGE | INTRAVENOUS | Status: DC | PRN

## 2019-05-08 MED ORDER — ZOLPIDEM TARTRATE 5 MG PO TABS: 5.0000 mg | ORAL_TABLET | Freq: Every evening | ORAL | Status: DC | PRN

## 2019-05-08 MED ORDER — PRENATAL MULTIVITAMIN CH
1.0000 | ORAL_TABLET | Freq: Every day | ORAL | Status: DC
  Administered 2019-05-09 – 2019-05-10 (×2): 1 via ORAL
  Filled 2019-05-08 (×2): qty 1

## 2019-05-08 MED ORDER — DIPHENHYDRAMINE HCL 50 MG/ML IJ SOLN: 12.5000 mg | INTRAMUSCULAR | Status: DC | PRN

## 2019-05-08 MED ORDER — EPHEDRINE 5 MG/ML INJ: 10.0000 mg | INTRAVENOUS | Status: DC | PRN

## 2019-05-08 MED ORDER — SIMETHICONE 80 MG PO CHEW: 80.0000 mg | CHEWABLE_TABLET | ORAL | Status: DC | PRN

## 2019-05-08 MED ORDER — COCONUT OIL OIL
1.0000 "application " | TOPICAL_OIL | Status: DC | PRN
  Administered 2019-05-09: 1 via TOPICAL

## 2019-05-08 MED ORDER — IBUPROFEN 600 MG PO TABS
600.0000 mg | ORAL_TABLET | Freq: Four times a day (QID) | ORAL | Status: DC
  Administered 2019-05-08 – 2019-05-10 (×7): 600 mg via ORAL
  Filled 2019-05-08 (×7): qty 1

## 2019-05-08 MED ORDER — BENZOCAINE-MENTHOL 20-0.5 % EX AERO
1.0000 "application " | INHALATION_SPRAY | CUTANEOUS | Status: DC | PRN
  Filled 2019-05-08: qty 56

## 2019-05-08 MED ORDER — LACTATED RINGERS AMNIOINFUSION: INTRAVENOUS | Status: DC

## 2019-05-08 MED ORDER — SENNOSIDES-DOCUSATE SODIUM 8.6-50 MG PO TABS
2.0000 | ORAL_TABLET | ORAL | Status: DC
  Administered 2019-05-08 – 2019-05-09 (×2): 2 via ORAL
  Filled 2019-05-08 (×2): qty 2

## 2019-05-08 MED ORDER — SODIUM CHLORIDE (PF) 0.9 % IJ SOLN
INTRAMUSCULAR | Status: DC | PRN
  Administered 2019-05-08: 12 mL/h via EPIDURAL

## 2019-05-08 MED ORDER — FAMOTIDINE IN NACL 20-0.9 MG/50ML-% IV SOLN
20.0000 mg | Freq: Once | INTRAVENOUS | Status: AC
  Administered 2019-05-08: 04:00:00 20 mg via INTRAVENOUS
  Filled 2019-05-08: qty 50

## 2019-05-08 MED ORDER — LACTATED RINGERS IV SOLN: 500.0000 mL | Freq: Once | INTRAVENOUS | Status: DC

## 2019-05-08 MED ORDER — OXYCODONE HCL 5 MG PO TABS
5.0000 mg | ORAL_TABLET | ORAL | Status: DC | PRN
  Administered 2019-05-09: 5 mg via ORAL
  Filled 2019-05-08: qty 1

## 2019-05-08 MED ORDER — OXYCODONE HCL 5 MG PO TABS: 10.0000 mg | ORAL_TABLET | ORAL | Status: DC | PRN

## 2019-05-08 MED ORDER — WITCH HAZEL-GLYCERIN EX PADS: 1.0000 "application " | MEDICATED_PAD | CUTANEOUS | Status: DC | PRN

## 2019-05-08 MED ORDER — ACETAMINOPHEN 325 MG PO TABS: 650.0000 mg | ORAL_TABLET | ORAL | Status: DC | PRN

## 2019-05-08 MED ORDER — ONDANSETRON HCL 4 MG PO TABS: 4.0000 mg | ORAL_TABLET | ORAL | Status: DC | PRN

## 2019-05-08 MED ORDER — DIPHENHYDRAMINE HCL 25 MG PO CAPS: 25.0000 mg | ORAL_CAPSULE | Freq: Four times a day (QID) | ORAL | Status: DC | PRN

## 2019-05-08 MED ORDER — TETANUS-DIPHTH-ACELL PERTUSSIS 5-2.5-18.5 LF-MCG/0.5 IM SUSP: 0.5000 mL | Freq: Once | INTRAMUSCULAR | Status: DC

## 2019-05-08 MED ORDER — FENTANYL-BUPIVACAINE-NACL 0.5-0.125-0.9 MG/250ML-% EP SOLN
12.0000 mL/h | EPIDURAL | Status: DC | PRN
  Filled 2019-05-08: qty 250

## 2019-05-08 NOTE — Anesthesia Preprocedure Evaluation (Signed)
Anesthesia Evaluation  Patient identified by MRN, date of birth, ID band Patient awake    Reviewed: Allergy & Precautions, NPO status , Patient's Chart, lab work & pertinent test results  Airway Mallampati: II  TM Distance: >3 FB     Dental   Pulmonary neg pulmonary ROS,    Pulmonary exam normal        Cardiovascular hypertension, Normal cardiovascular exam     Neuro/Psych negative neurological ROS     GI/Hepatic negative GI ROS, Neg liver ROS,   Endo/Other  negative endocrine ROS  Renal/GU negative Renal ROS     Musculoskeletal   Abdominal   Peds  Hematology negative hematology ROS (+)   Anesthesia Other Findings   Reproductive/Obstetrics (+) Pregnancy                             Anesthesia Physical Anesthesia Plan  ASA: III  Anesthesia Plan: Epidural   Post-op Pain Management:    Induction:   PONV Risk Score and Plan: Treatment may vary due to age or medical condition  Airway Management Planned: Natural Airway  Additional Equipment:   Intra-op Plan:   Post-operative Plan:   Informed Consent: I have reviewed the patients History and Physical, chart, labs and discussed the procedure including the risks, benefits and alternatives for the proposed anesthesia with the patient or authorized representative who has indicated his/her understanding and acceptance.       Plan Discussed with:   Anesthesia Plan Comments:         Anesthesia Quick Evaluation

## 2019-05-08 NOTE — Anesthesia Procedure Notes (Signed)
Epidural Patient location during procedure: OB Start time: 05/08/2019 11:10 AM End time: 05/08/2019 11:20 AM  Staffing Anesthesiologist: Suzette Battiest, MD Performed: anesthesiologist   Preanesthetic Checklist Completed: patient identified, IV checked, site marked, risks and benefits discussed, surgical consent, monitors and equipment checked, pre-op evaluation and timeout performed  Epidural Patient position: sitting Prep: DuraPrep and site prepped and draped Patient monitoring: continuous pulse ox and blood pressure Approach: midline Location: L3-L4 Injection technique: LOR air  Needle:  Needle type: Tuohy  Needle gauge: 17 G Needle length: 9 cm and 9 Needle insertion depth: 7 cm Catheter type: closed end flexible Catheter size: 19 Gauge Catheter at skin depth: 13 (12-->13cm when laid in lat decub position) cm Test dose: negative  Assessment Events: blood not aspirated, injection not painful, no injection resistance, no paresthesia and negative IV test

## 2019-05-08 NOTE — Progress Notes (Signed)
LABOR PROGRESS NOTE  Tara Mathews is a 29 y.o. G1P0 at [redacted]w[redacted]d  admitted for IOL due to PreE  Subjective: Patient is feeling increasing pressure in the perineum and lower pelvis. Overall progressing well.  Objective: BP 125/73   Pulse 81   Temp 97.9 F (36.6 C) (Oral)   Resp 18   Ht 4\' 11"  (1.499 m)   Wt 94.8 kg   LMP 08/02/2018 (Approximate)   SpO2 98%   BMI 42.21 kg/m   Dilation: 9 Effacement (%): 100 Cervical Position: Mid position Station: Plus 1 Presentation: Vertex Exam by:: Dr Chauncey Reading Fetal monitoring: Baseline: 145 bpm, Variability: Good {> 6 bpm), Accelerations: Reactive and Decelerations: Variable: moderate Uterine activity: Frequency: Every 2-3 minutes, Duration: ~40-60 seconds and Intensity: severe  Labs: Lab Results  Component Value Date   WBC 9.2 05/07/2019   HGB 13.0 05/07/2019   HCT 39.7 05/07/2019   MCV 85.2 05/07/2019   PLT 294 05/07/2019    Patient Active Problem List   Diagnosis Date Noted  . Labor and delivery, indication for care 05/07/2019  . Preeclampsia 05/07/2019  . Positive GBS test 05/02/2019  . Obesity affecting pregnancy, antepartum 10/18/2018  . Encounter for supervision of normal pregnancy 10/17/2018    Assessment / Plan: Induction of labor due to preeclampsia,  progressing well  #Labor: Progressing on Pitocin, s/p AROM w/ thick meconium. S/p Cytotec x1. S/p FB. Pitocin @ 8 mu/min #PreE w/o SF: patient stable, most recent BP normotensive, two severe range pressures overnight 160-170s. Continue to monitor vitals and clinical status. #Fetal Wellbeing:  Category II #Pain Control: Labor support without medications per patient request #ID: GBS positive, PCN #Anticipated MOD: NSVD  #Gastroesophageal reflux: acute, IV omeprazole   Gladys Damme, MD, PGY-1 Family Medicine Resident, [facility] Faculty Teaching Service  05/08/2019, 8:23 AM

## 2019-05-08 NOTE — Progress Notes (Addendum)
Went bedside for prolonged decel. Pit turned off. Difficult to trace baby as patient shaking. FHR placed. Cervix still 9.5/100/0. Position change to exaggerated right side and recovery in heart rate. Terbutaline pulled but ultimately not given as FHR recovered. Will leave Pit off and evaluate for adequate ctx. Recheck in 1-2 hours or PRN. Continue position changes. Anticipate SVD.  Barrington Ellison, MD [facility] Family Medicine Fellow, [facility] for [employer], [place]

## 2019-05-08 NOTE — Progress Notes (Signed)
LABOR PROGRESS NOTE  Tara Mathews is a 29 y.o. G1P0 at [redacted]w[redacted]d  admitted for IOL due to PreE  Subjective: Patient is feeling acid reflux symptoms and feeling increasing pressure in the perineum and lower pelvis. Overall progressing well.  Objective: BP (!) 147/89   Pulse 88   Temp 98.5 F (36.9 C) (Oral)   Resp 20   Ht 4\' 11"  (1.499 m)   Wt 94.8 kg   LMP 08/02/2018 (Approximate)   SpO2 98%   BMI 42.21 kg/m   Dilation: 5.5 Effacement (%): 100 Cervical Position: Posterior Station: 0 Presentation: Vertex Exam by:: Dr. Dione Plover Fetal monitoring: Baseline: 150 bpm, Variability: Good {> 6 bpm), Accelerations: Reactive and Decelerations: Variable: moderate Uterine activity: Frequency: Every 2-3 minutes, Duration: ~40-60 seconds and Intensity: moderate  Labs: Lab Results  Component Value Date   WBC 9.2 05/07/2019   HGB 13.0 05/07/2019   HCT 39.7 05/07/2019   MCV 85.2 05/07/2019   PLT 294 05/07/2019    Patient Active Problem List   Diagnosis Date Noted  . Labor and delivery, indication for care 05/07/2019  . Preeclampsia 05/07/2019  . Positive GBS test 05/02/2019  . Obesity affecting pregnancy, antepartum 10/18/2018  . Encounter for supervision of normal pregnancy 10/17/2018    Assessment / Plan: Induction of labor due to preeclampsia,  progressing well  #Labor: Progressing on Pitocin, s/p AROM w/ thick meconium. S/p Cytotec x1. S/p FB. Pitocin @ 8 mu/min #PreE w/o SF: patient stable, avg SBP <160 (with one reading of 170), DBP <110. Continue to monitor vitals and clinical status. #Fetal Wellbeing:  Category II #Pain Control: Labor support without medications per patient request #ID: GBS positive, PCN #Anticipated MOD: NSVD  #Gastroesophageal reflux: acute, IV omeprazole   Bud Face, DO, PGY-1 Family Medicine Resident, [employer] [facility] Faculty Teaching Service  05/08/2019, 2:58 AM

## 2019-05-08 NOTE — Progress Notes (Signed)
LABOR PROGRESS NOTE  Tara Mathews is a 29 y.o. G1P0 at [redacted]w[redacted]d  admitted for IOL due to PreE  Subjective: Epidural in place, patient is much more comfortable  Objective: BP 125/63   Pulse 70   Temp 97.9 F (36.6 C) (Oral)   Resp 18   Ht 4\' 11"  (1.499 m)   Wt 94.8 kg   LMP 08/02/2018 (Approximate)   SpO2 100%   BMI 42.21 kg/m   Dilation: 8 Effacement (%): 90 Cervical Position: Mid position Station: Plus 1 Presentation: Vertex Exam by:: Dr. Marice Potter Fetal monitoring: Baseline: 150 bpm, Variability: Good {> 6 bpm), Accelerations: Reactive and Decelerations: Variable: moderate Uterine activity: Frequency: Every 2-3 minutes, Duration: ~40-60 seconds and Intensity: severe  Labs: Lab Results  Component Value Date   WBC 21.6 (H) 05/08/2019   HGB 12.9 05/08/2019   HCT 40.0 05/08/2019   MCV 85.8 05/08/2019   PLT 335 05/08/2019    Patient Active Problem List   Diagnosis Date Noted  . Labor and delivery, indication for care 05/07/2019  . Preeclampsia 05/07/2019  . Positive GBS test 05/02/2019  . Obesity affecting pregnancy, antepartum 10/18/2018  . Encounter for supervision of normal pregnancy 10/17/2018    Assessment / Plan: Induction of labor due to preeclampsia  #Labor: Progressing on Pitocin, s/p AROM w/ thick meconium. S/p Cytotec x1. S/p FB. Pitocin @ 14 mu/min. Infant's head asynclitic, infant's head pushed up to achieve better position in pelvis. IUPC placed, will monitor MV units for adequacy of contractions. Plan to change positions with side-lying and peanut ball. #PreE w/o SF: patient stable, most recent BP normotensive, two severe range pressures overnight 160-170s. Continue to monitor vitals and clinical status. #Fetal Wellbeing:  Category II #Pain Control: Labor support without medications per patient request #ID: GBS positive, PCN #Anticipated MOD: NSVD  #Gastroesophageal reflux: acute, IV omeprazole   Gladys Damme, MD, PGY-1 Family Medicine  Resident, [facility] Faculty Teaching Service  05/08/2019, 12:21 PM

## 2019-05-08 NOTE — Progress Notes (Addendum)
LABOR PROGRESS NOTE  Tara Tara Mathews is a 29 y.o. G1P0 at [redacted]w[redacted]d  admitted for IOL due to PreE  Subjective: Patient is very uncomfortable, tearful during ctx.  Objective: BP 139/82 (BP Location: Right Arm)   Pulse 75   Temp 97.9 F (36.6 C) (Oral)   Resp 20   Ht 4\' 11"  (1.499 m)   Wt 94.8 kg   LMP 08/02/2018 (Approximate)   SpO2 98%   BMI 42.21 kg/m   Dilation: 8 Effacement (%): 90 Cervical Position: Mid position Station: Plus 1 Presentation: Vertex Exam by:: Dr. Marice Potter Fetal monitoring: Baseline: 145 bpm, Variability: Good {> 6 bpm), Accelerations: Reactive and Decelerations: Variable: moderate Uterine activity: Frequency: Every 2-3 minutes, Duration: ~40-60 seconds and Intensity: severe  Labs: Lab Results  Component Value Date   WBC 9.2 05/07/2019   HGB 13.0 05/07/2019   HCT 39.7 05/07/2019   MCV 85.2 05/07/2019   PLT 294 05/07/2019    Patient Active Problem List   Diagnosis Date Noted  . Labor and delivery, indication for care 05/07/2019  . Preeclampsia 05/07/2019  . Positive GBS test 05/02/2019  . Obesity affecting pregnancy, antepartum 10/18/2018  . Encounter for supervision of normal pregnancy 10/17/2018    Assessment / Plan: Induction of labor due to preeclampsia,  progressing well  #Labor: Progressing on Pitocin, s/p AROM w/ thick meconium. S/p Cytotec x1. S/p FB. Pitocin @ 10 mu/min, can consider IUPC. Will try hands and knees to reduce cervical swelling. #PreE w/o SF: patient stable, most recent BP normotensive, two severe range pressures overnight 160-170s. Continue to monitor vitals and clinical status. #Fetal Wellbeing:  Category II #Pain Control: Labor support without medications per patient request #ID: GBS positive, PCN #Anticipated MOD: NSVD  #Gastroesophageal reflux: acute, IV omeprazole   Tara Damme, MD, PGY-1 Family Medicine Resident, [facility] Faculty Teaching Tara Mathews  05/08/2019, 9:38 AM

## 2019-05-08 NOTE — Discharge Summary (Addendum)
Postpartum Discharge Summary    Patient Name: Tara Mathews DOB: Apr 10, 1991 MRN: [id number]  Date of admission: 05/07/2019 Delivering Provider: Chauncey Mann   Date of discharge: 05/10/2019  Admitting diagnosis: Labor and delivery, indication for care [O75.9] Intrauterine pregnancy: [redacted]w[redacted]d    Secondary diagnosis:  Active Problems:   Obesity affecting pregnancy, antepartum   Positive GBS test   Labor and delivery, indication for care   Preeclampsia   Shoulder dystocia during labor and delivery  Additional problems: None     Discharge diagnosis: Term Pregnancy Delivered and Preeclampsia (mild)                                                                                                Post partum procedures:none  Augmentation: AROM, Pitocin, Cytotec and Foley Balloon  Complications: None  Hospital course:  Induction of Labor With Vaginal Delivery   29y.o. yo G1P1001 at 379w6das admitted to the hospital 05/07/2019 for induction of labor.  Indication for induction: Preeclampsia.  Patient had a labor course as follows: Patient presented to [facility] for labor check and found to have elevated BP's, ultimately Pre-E without severe features. Initial SVE 0.5/thick/-3. Received Cytotec, Foley balloon, Pitocin and AROM. Received epidural and progressed to complete. Delivery complicated by 32 second should dystocia.  Membrane Rupture Time/Date: 9:03 PM ,05/07/2019   Intrapartum Procedures: Episiotomy: None [1]                                         Lacerations:  Labial [10];1st degree [2];Perineal [11]  Patient had delivery of a Viable infant.  Information for the patient's newborn:  AgLoralee, Weitzman0[[id number]]Delivery Method: Vag-Spont    05/08/2019  Details of delivery can be found in separate delivery note. BP's monitored post-partum and remained normotensive. Patient had a routine postpartum course. Patient is discharged home 05/10/19. Delivery time: 4:55 PM     Magnesium Sulfate received: No BMZ received: No Rhophylac:No MMR:No Transfusion:No  Physical exam  Vitals:   05/09/19 0844 05/09/19 1435 05/09/19 2137 05/10/19 0514  BP: 106/70 119/76 113/72 118/63  Pulse: 68 72 72 68  Resp: _0 Temp: 98 F (36.7 C) 97.6 F (36.4 C) 97.9 F (36.6 C) (!) 97.5 F (36.4 C)  TempSrc: Oral Oral Oral Oral  SpO2: 100%  100% 100%  Weight:      Height:       General: alert, cooperative and no distress Lochia: appropriate Uterine Fundus: firm Incision: N/A DVT Evaluation: No evidence of DVT seen on physical exam. No cords or calf tenderness. No significant calf/ankle edema. Labs: Lab Results  Component Value Date   WBC 20.2 (H) 05/08/2019   HGB 11.7 (L) 05/08/2019   HCT 35.3 (L) 05/08/2019   MCV 86.1 05/08/2019   PLT 275 05/08/2019   CMP Latest Ref Rng & Units 05/07/2019  Glucose 70 - 99 mg/dL 92  BUN 6 - 20 mg/dL 12  Creatinine 0.44 - 1.00 mg/dL  0.51  Sodium 135 - 145 mmol/L 137  Potassium 3.5 - 5.1 mmol/L 3.9  Chloride 98 - 111 mmol/L 107  CO2 22 - 32 mmol/L 18(L)  Calcium 8.9 - 10.3 mg/dL 9.2  Total Protein 6.5 - 8.1 g/dL 6.6  Total Bilirubin 0.3 - 1.2 mg/dL 0.5  Alkaline Phos 38 - 126 U/L 110  AST 15 - 41 U/L 21  ALT 0 - 44 U/L 25    Discharge instruction: per After Visit Summary and "Baby and Me Booklet".  After visit meds:  Allergies as of 05/10/2019      Reactions   Bactrim [sulfamethoxazole-trimethoprim] Hives      Medication List    STOP taking these medications   cephALEXin 500 MG capsule Commonly known as: KEFLEX   valACYclovir 1000 MG tablet Commonly known as: Valtrex     TAKE these medications   acetaminophen 325 MG tablet Commonly known as: Tylenol Take 2 tablets (650 mg total) by mouth every 4 (four) hours as needed (for pain scale < 4).   Blood Pressure Monitor Kit 1 kit by Does not apply route once a week. CHECK BP WEEKLY.  LARGE CUFF  DX:  Z13.6         Z34.86   ibuprofen 600 MG  tablet Commonly known as: ADVIL Take 1 tablet (600 mg total) by mouth every 6 (six) hours.   norethindrone 0.35 MG tablet Commonly known as: Ortho Micronor Take 1 tablet (0.35 mg total) by mouth daily.   Prenatal 27-1 MG Tabs Take 1 tablet by mouth daily.       Diet: routine diet  Activity: Advance as tolerated. Pelvic rest for 6 weeks.   Outpatient follow up:BP check in 1 wk; PP visit in 4wks Follow up Appt: Future Appointments  Date Time Provider [place]  05/14/2019  9:15 AM [facility] None  05/15/2019  1:30 PM [facility] NURSE [facility] None  06/06/2019  9:35 AM Leftwich-Kirby, Kathie Dike, CNM [facility] None   Follow up Visit:    Please schedule this patient for Postpartum visit in: 4 weeks with the following provider: Any provider Virtual Low risk pregnancy complicated by: Pre-E Delivery mode:  SVD Anticipated Birth Control:  POPs PP Procedures needed: BP check  Schedule Integrated BH visit: no   Newborn Data: Live born female  Birth Weight: 3235g  APGAR: 4, 9; cord pH 7.293  Newborn Delivery   Time head delivered: 05/08/2019 16:55:00 Birth date/time: 05/08/2019 16:55:00 Delivery type: Vaginal, Spontaneous      Baby Feeding: Breast Disposition:home with mother   05/10/2019 Tara Damme, MD  CNM attestation I have seen and examined this patient and agree with above documentation in the resident's note.   Tara Mathews is a 29 y.o. G1P1001 s/p vag del from IOL for pre-e w/o severe features.   Pain is well controlled.  Plan for birth control is oral progesterone-only contraceptive.  Method of Feeding: breast  PE:  BP 118/63 (BP Location: Right Arm)   Pulse 68   Temp (!) 97.5 F (36.4 C) (Oral)   Resp 16   Ht _0  (1.499 m)   Wt 94.8 kg   LMP 08/02/2018 (Approximate)   SpO2 100%   Breastfeeding Unknown   BMI 42.21 kg/m  Fundus firm  Recent Labs    05/08/19 1037 05/08/19 1847  HGB 12.9 11.7*  HCT 40.0 35.3*     Plan:  discharge today - postpartum care discussed - f/u clinic in 1wk for BP check; 4  weeks for postpartum visit   Tara Mathews, CNM 10:20 PM  05/10/2019

## 2019-05-09 ENCOUNTER — Encounter: Payer: Self-pay | Admitting: Certified Nurse Midwife

## 2019-05-09 NOTE — Lactation Note (Signed)
This note was copied from a baby's chart. Lactation Consultation Note  Patient Name: Tara Mathews [id number] Date: 05/09/2019 Reason for consult: Follow-up assessment;Term;Primapara;1st time breastfeeding  LC in to visit with P70 Mom of 50 hr old term baby.  Mom semi- reclined back in bed, tipped to her side trying to latch baby to the breast.  Offered to assist Mom.    Mom's perineum sore, so she is wanting to be tilted to the side.  Pillow support added.  Baby laid across Mom's chest.  Mom wearing bra and shells.  Nipples flat, areola very compressible.  Baby opened and latched deeply to breast.  Showed Mom how to access baby's depth on the breast.  Lips flanged and baby sucking with deep jaw extensions.  Swallows identified for Mom.    Encouraged Mom to keep baby STS as much as possible and offer the breast with cues.  Mom to ask for assistance with latch prn.   Mom denied any pain with latch.  Baby continued to feed for over 15 mins when left room.   Feeding Feeding Type: Breast Fed  LATCH Score Latch: Grasps breast easily, tongue down, lips flanged, rhythmical sucking.  Audible Swallowing: Spontaneous and intermittent  Type of Nipple: Flat  Comfort (Breast/Nipple): Soft / non-tender  Hold (Positioning): Assistance needed to correctly position infant at breast and maintain latch.  LATCH Score: 8  Interventions Interventions: Breast feeding basics reviewed;Assisted with latch;Skin to skin;Pre-pump if needed;Adjust position;Breast compression;Support pillows;Position options;Hand pump  Lactation Tools Discussed/Used Tools: Pump;Shells Shell Type: Inverted Breast pump type: Manual   Consult Status Consult Status: Follow-up Date: 05/10/19 Follow-up type: [place] 05/09/2019, 2:25 PM

## 2019-05-09 NOTE — Lactation Note (Addendum)
This note was copied from a baby's chart. Lactation Consultation Note Baby 25 hrs old. Baby sleeping in bassinet after feeding. Mom stated BF 15 minutes. Noted softening to breast BF on verses opposite breast. Mom has Large pendulous breast. Nipples flat compressible. Demonstrated reveres pressure in case mom's gets edema. Hand expression and breast massage demonstrated collected 1 ml. Discussed spoon feeding for stimulation to feed if needed.  Encouraged wearing bra in am to prevent edema as well as shells to evert nipples. Encouraged football hold to obtain deep latch. Discussed support  During feeding. Breast massage, STS, I&O, milk storage, pumping, supply and demand discussed. Mom encouraged to feed baby 8-12 times/24 hours and with feeding cues. Mom encouraged to waken baby for feeds.  Encouraged to call for assistance or questions. Lactation brochure given.  Patient Name: Tara Mathews [id number] Date: 05/09/2019 Reason for consult: Initial assessment;Primapara;Term   Maternal Data Has patient been taught Hand Expression?: Yes Does the patient have breastfeeding experience prior to this delivery?: No  Feeding Feeding Type: Breast Fed  LATCH Score Latch: Repeated attempts needed to sustain latch, nipple held in mouth throughout feeding, stimulation needed to elicit sucking reflex.  Audible Swallowing: A few with stimulation  Type of Nipple: Flat  Comfort (Breast/Nipple): Filling, red/small blisters or bruises, mild/mod discomfort(breast very heavy)  Hold (Positioning): Assistance needed to correctly position infant at breast and maintain latch.  LATCH Score: 6  Interventions Interventions: Breast feeding basics reviewed;Support pillows;Position options;Expressed milk;Breast massage;Hand express;Shells;Pre-pump if needed;Reverse pressure;Hand pump;Breast compression  Lactation Tools Discussed/Used Tools: Pump;Shells Shell Type: Inverted Breast pump type: Manual WIC  Program: Yes Pump Review: Setup, frequency, and cleaning;Milk Storage Initiated by:: RN   Consult Status Consult Status: Follow-up Date: 05/09/19 Follow-up type: In-patient    Aidenn Skellenger, Elta [place] 05/09/2019, 1:19 AM

## 2019-05-09 NOTE — Anesthesia Postprocedure Evaluation (Signed)
Anesthesia Post Note  Patient: Tara Mathews  Procedure(s) Performed: AN AD [place]     Patient location during evaluation: Mother Baby Anesthesia Type: Epidural Level of consciousness: awake, awake and alert and oriented Pain management: pain level controlled Vital Signs Assessment: post-procedure vital signs reviewed and stable Respiratory status: spontaneous breathing, nonlabored ventilation and respiratory function stable Cardiovascular status: stable Postop Assessment: no headache, patient able to bend at knees, no apparent nausea or vomiting, adequate PO intake, able to ambulate and no backache Anesthetic complications: no    Last Vitals:  Vitals:   05/09/19 0058 05/09/19 0509  BP: 123/61 99/70  Pulse: 80 71  Resp: 16   Temp: 36.7 C 36.6 C  SpO2:      Last Pain:  Vitals:   05/09/19 0844  TempSrc:   PainSc: 7    Pain Goal:                   Tracye Szuch

## 2019-05-09 NOTE — Progress Notes (Signed)
CSW received consult for hx of Anxiety. CSW met with MOB to offer support and complete assessment.    CSW congratulated MOB and FOB on the birth of infant. CSW advised MOB of CSW's role and the reason for CSW coming to visit with her. MOB reports that she had anxiety around the age of 18 but nothing since then. MOB reports that was on medications at that time as she would have panic attacks often. MOB denies having any anxiety or panic attacks while pregnant. MOB expressed that she was fine and denied SI or HI. MOB reports that she has all needed items to care for infant with follow up care at [facility].  CSW provided education regarding the baby blues period vs. perinatal mood disorders, discussed treatment and gave resources for mental health follow up if concerns arise.  CSW recommends self-evaluation during the postpartum time period using the New Mom Checklist from Postpartum Progress and encouraged MOB to contact a medical professional if symptoms are noted at any time.   CSW provided review of Sudden Infant Death Syndrome (SIDS) precautions.   CSW identifies no further need for intervention and no barriers to discharge at this time.   Kierra S. Wiley, MSW, LCSW [facility] [place] [phone]   

## 2019-05-09 NOTE — Progress Notes (Addendum)
Post Partum Day 1 Subjective: no complaints, up ad lib, voiding, tolerating PO and + flatus  Objective: Blood pressure 99/70, pulse 71, temperature 97.9 F (36.6 C), temperature source Axillary, resp. rate 16, height 4\' 11"  (1.499 m), weight 94.8 kg, last menstrual period 08/02/2018, SpO2 100 %, unknown if currently breastfeeding.  Physical Exam:  General: alert, cooperative, appears stated age and no distress Lochia: appropriate Uterine Fundus: firm Incision: n/a DVT Evaluation: No evidence of DVT seen on physical exam. No significant calf/ankle swelling  Recent Labs    05/08/19 1037 05/08/19 1847  HGB 12.9 11.7*  HCT 40.0 35.3*    Assessment/Plan: Breastfeeding and Lactation consult. Possible discharge late this afternoon at 24 hours.   LOS: 2 days   [facility] 05/09/2019, 8:02 AM   OB FELLOW POSTPARTUM PROGRESS NOTE ATTESTATION  I have seen and examined this patient and agree with above documentation in the resident's note.   Phill Myron, D.O. OB Fellow  05/09/2019, 10:27 AM

## 2019-05-10 MED ORDER — NORETHINDRONE 0.35 MG PO TABS: 1.0000 | ORAL_TABLET | Freq: Every day | ORAL | 11 refills | Status: DC

## 2019-05-10 MED ORDER — IBUPROFEN 600 MG PO TABS: 600.0000 mg | ORAL_TABLET | Freq: Four times a day (QID) | ORAL | 0 refills | Status: DC

## 2019-05-10 MED ORDER — ACETAMINOPHEN 325 MG PO TABS: 650.0000 mg | ORAL_TABLET | ORAL | 0 refills | Status: DC | PRN

## 2019-05-10 NOTE — Lactation Note (Signed)
This note was copied from a baby's chart. Lactation Consultation Note  Patient Name: Tara Mathews [id number] Date: 05/10/2019   Infant is 21 hrs old. Mom was able to eat breakfast & get some sleep. I assisted Mom in latching to the R breast using the teacup hold. Infant latched w/relative ease. Occasionally dimpling was noted, which Mom was able to correlate with a change in his latch. I encouraged Mom to relatch when she sees dimpling. Mom notes that he is dimpling less than he did initially.  Parents say that he falls asleep at breast within 5-10 min after latching. I encouraged parents that if he falls asleep after having latched well, burp him, and offer the other side. Then, if he is still showing cues, feed him with DBM until content. Mom will then pump.   Swallows verified by cervical auscultation.   Matthias Hughs [facility] 05/10/2019, 10:36 AM

## 2019-05-10 NOTE — Lactation Note (Addendum)
This note was copied from a baby's chart. Lactation Consultation Note  Patient Name: Tara Mathews [id number] Date: 05/10/2019   I entered room & Mom stated that she didn't think that infant wasn't getting enough, as he would cry after feeding from both breasts. Mom said she is comfortable with latch & she hears swallows (previous LATCH scores demonstrate others have noted swallowing, also).   Infant was cup-fed DBM until content (25 mL). A slight divet was noted in the tip of his tongue with tongue movements with a slightly higher palate. Tongue extension seemed to be restricted while cup feeding. On suck exam, infant able to bring finger to juncture of hard & soft palate.  Mom's L nipple tip is mildly abraded. I assisted with hand expression & Mom was able to see a bit of colostrum (the most she has noted thus far).   Mom is very tired. I encouraged Mom to eat breakfast, get some sleep, & Dad can call for me to assist with cup feeding at next feeding so that Mom can sleep.    Matthias Hughs [facility] 05/10/2019, 8:41 AM

## 2019-05-10 NOTE — Lactation Note (Signed)
This note was copied from a baby's chart. Lactation Consultation Note Baby 65 hrs old. [place] asked mom to call for next feeding. Mom called. Mom placed baby in football position. Latched was shallow d/t nipple to low in baby's mouth.  Demonstrated proper latch w/nipple more towards upper mouth verses lower mouth. Baby was able to maintain deep latch. [place] swallows mom denied pain. LC feels that baby is transferring when latched properly.  Fitted mom #20 NS, latched baby. Noted colostrum in NS. Since nipple and areola compressible suggested mom latch to natural nipple. If mom's breast are full, suggested hand pump finger massage to soften tissue up at nipple and areola to make compressible enough for baby to latch. Mom states understanding. FOB supportive listening to teaching. FOB stated he has learned a lot. Parents appreciative for assistance. Encouraged mom to pump q3 hrs for stimulation until milk volume increases. At next feeding FOB will give the rest of Donor milk after BF. Will see from there how baby's out put is to see if needs supplemented any more or not. Encouraged mom to hand express after pumping give colostrum.  Encouraged to call for assistance or questions.  Patient Name: Tara Mathews [id number] Date: 05/10/2019 Reason for consult: Follow-up assessment;Primapara   Maternal Data    Feeding Feeding Type: Breast Fed  LATCH Score Latch: Grasps breast easily, tongue down, lips flanged, rhythmical sucking.  Audible Swallowing: Spontaneous and intermittent  Type of Nipple: Flat  Comfort (Breast/Nipple): Soft / non-tender  Hold (Positioning): Assistance needed to correctly position infant at breast and maintain latch.  LATCH Score: 8  Interventions Interventions: Breast feeding basics reviewed;Support pillows;Assisted with latch;Position options;Skin to skin;Breast massage;Breast compression;Adjust position  Lactation Tools Discussed/Used Tools: Nipple  Shields Nipple shield size: 20;24(tried) Shell Type: Inverted Breast pump type: Double-Electric Breast Pump Pump Review: Setup, frequency, and cleaning;Milk Storage Initiated by:: Allayne Stack RN IBCLC Date initiated:: 05/10/19   Consult Status Consult Status: Follow-up Date: 05/10/19 Follow-up type: In-patient    Brittian Renaldo, Elta [place] 05/10/2019, 2:22 AM

## 2019-05-10 NOTE — Lactation Note (Signed)
This note was copied from a baby's chart. Lactation Consultation Note Baby 71 hrs old. LC concerned that no output since 1400 today. Baby starting to cluster feed. Fussy.  Mom has flat nipples Large breast. LC hand expressed some colostrum. Rt. Breast very heavy not as easy to express. Nipples not as compressible as they were yesterday. Mom stated she wore her shells today. Mom shown how to use DEBP & how to disassemble, clean, & reassemble parts. Mom knows to pump q3h for 15-20 min. Mom agreed to pump to initate lactation. Mom pump collected a few drops of colostrum.  Discussed supplementing  Baby w/Donor milk. Mom agreed. Mom signed consent. LC reviewed Donor milk, milk storage, and how to use. LC gave 12 ml in foley cup. Baby took well. Asked mom to call for next feed. LC wants to see latch, check for transfer, and assess for need of NS. D/t mom's breast anatomy mom's breast may not be conducive for NS. LC will assess.  Patient Name: Tara Mathews [id number] Date: 05/10/2019 Reason for consult: Follow-up assessment;Primapara;Term   Maternal Data    Feeding Feeding Type: Donor Breast Milk  LATCH Score       Type of Nipple: Flat  Comfort (Breast/Nipple): Soft / non-tender        Interventions Interventions: Expressed milk;Breast massage;Coconut oil;Hand express;Breast compression;DEBP  Lactation Tools Discussed/Used Tools: Pump;Shells Shell Type: Inverted Breast pump type: Double-Electric Breast Pump Pump Review: Setup, frequency, and cleaning;Milk Storage Initiated by:: Allayne Stack RN IBCLC Date initiated:: 05/10/19   Consult Status Consult Status: Follow-up Date: 05/10/19 Follow-up type: In-patient    Florean Hoobler, Elta [place] 05/10/2019, 1:05 AM

## 2019-05-11 ENCOUNTER — Ambulatory Visit: Payer: Self-pay

## 2019-05-11 NOTE — Lactation Note (Addendum)
This note was copied from a baby's chart. Lactation Consultation Note Baby 38 hrs old. RN showed LC diaper w/a lot of urine and urate crystals. Parents are supplementing w/formula tonight since there is no donor milk available. Mom is BF, pumping, giving baby colostrum then supplementing. 39 bili level is rising. Baby hasn't been having a lot of stools. Will encouraged parents to increase supplement amount. Reviewed amount needing at hours of age. Moms nipples are flat. Can hear baby swallowing.  Baby only had 1 void and 1 stool yesterday. Explained day 3 baby should have 3 voids and 3 stools. LC concerned baby may not be transferring enough. I know he is some d/t hearing swallows and feeling change in breast after feeding. Encouraged to call [facility] or [facility] to make f/u appt.   Patient Name: Tara Mathews Bottaro [id number] Date: 05/11/2019 Reason for consult: Follow-up assessment;Hyperbilirubinemia;Primapara   Maternal Data    Feeding Feeding Type: Breast Fed  LATCH Score                   Interventions    Lactation Tools Discussed/Used     Consult Status Consult Status: Follow-up Date: 05/12/19 Follow-up type: In-patient    Theodoro Kalata 05/11/2019, 2:49 AM

## 2019-05-14 ENCOUNTER — Other Ambulatory Visit (HOSPITAL_COMMUNITY): Admission: RE | Admit: 2019-05-14 | Payer: Self-pay | Source: Ambulatory Visit

## 2019-05-15 ENCOUNTER — Ambulatory Visit (INDEPENDENT_AMBULATORY_CARE_PROVIDER_SITE_OTHER): Payer: Medicaid Other

## 2019-05-15 ENCOUNTER — Other Ambulatory Visit: Payer: Self-pay

## 2019-05-15 VITALS — BP 127/85 | HR 94 | Temp 98.8°F | Ht 59.0 in | Wt 199.4 lb

## 2019-05-15 DIAGNOSIS — Z0130 Encounter for examination of blood pressure without abnormal findings: Secondary | ICD-10-CM

## 2019-05-15 NOTE — Progress Notes (Signed)
Subjective:  Tara Mathews is a 29 y.o. female here for BP check.   Hypertension ROS: taking medications as instructed, no medication side effects noted, no TIA's, no chest pain on exertion, no dyspnea on exertion and no swelling of ankles.    Objective:  BP 127/85   Pulse 94   Temp 98.8 F (37.1 C)   Ht 4\' 11"  (1.499 m)   Wt 199 lb 6.4 oz (90.4 kg)   LMP 08/02/2018 (Approximate)   Breastfeeding Yes   BMI 40.27 kg/m   Appearance alert, well appearing, and in no distress. General exam BP noted to be well controlled today in office.    Assessment:   Blood Pressure well controlled.   Plan:  Current treatment plan is effective, no change in therapy.per Dr. Jodi Mourning.

## 2019-05-16 ENCOUNTER — Inpatient Hospital Stay (HOSPITAL_COMMUNITY): Admission: AD | Admit: 2019-05-16 | Payer: Self-pay | Source: Home / Self Care | Admitting: Family Medicine

## 2019-05-16 ENCOUNTER — Inpatient Hospital Stay (HOSPITAL_COMMUNITY): Payer: Self-pay

## 2019-06-04 NOTE — Progress Notes (Signed)
Patient seen and assessed by nursing staff during this encounter. I have reviewed the chart and agree with the documentation and plan.  Mora Bellman, MD 06/04/2019 8:47 AM

## 2019-06-06 ENCOUNTER — Telehealth (INDEPENDENT_AMBULATORY_CARE_PROVIDER_SITE_OTHER): Payer: Medicaid Other | Admitting: Advanced Practice Midwife

## 2019-06-06 DIAGNOSIS — Z3009 Encounter for other general counseling and advice on contraception: Secondary | ICD-10-CM

## 2019-06-06 DIAGNOSIS — K2100 Gastro-esophageal reflux disease with esophagitis, without bleeding: Secondary | ICD-10-CM

## 2019-06-06 MED ORDER — PANTOPRAZOLE SODIUM 40 MG PO TBEC: 40.0000 mg | DELAYED_RELEASE_TABLET | Freq: Every day | ORAL | 0 refills | Status: DC

## 2019-06-06 NOTE — Progress Notes (Signed)
TELEHEALTH POSTPARTUM VIRTUAL VIDEO VISIT ENCOUNTER NOTE   Provider location: [facility] [place]   I connected with [name] on 06/06/19 at  9:35 AM EST by MyChart Video Encounter at home and verified that I am speaking with the correct person using two identifiers.    I discussed the limitations, risks, security and privacy concerns of performing an evaluation and management service virtually and the availability of in person appointments. I also discussed with the patient that there may be a patient responsible charge related to this service. The patient expressed understanding and agreed to proceed.  Chief Complaint: Postpartum Visit  History of Present Illness: Tara Mathews is a 29 y.o. Hispanic G1P1001 being evaluated for postpartum followup.     Tara Mathews is a 29 y.o. G18P1001 female who presents for a postpartum visit. She is 4 weeks postpartum following a spontaneous vaginal delivery. I have fully reviewed the prenatal and intrapartum course. The delivery was at 14 gestational weeks.  Anesthesia: epidural. Postpartum course has been Unremarkable. Baby's course has been Unremarkable. Baby is feeding by breast. Bleeding staining only. Bowel function is painful w/ BM . Bladder function is normal. Patient is not sexually active. Contraception method is none. Postpartum depression screening:neg  The following portions of the patient's history were reviewed and updated as appropriate: allergies, current medications, past family history, past medical history, past social history, past surgical history and problem list. Last pap smear done 10/18/2018 and was +HPV    Review of Systems: Her 12 point review of systems is negative or as noted in the History of Present Illness.  Patient Active Problem List   Diagnosis Date Noted  . Shoulder dystocia during labor and delivery 05/08/2019  . Labor and delivery, indication for care  05/07/2019  . Preeclampsia 05/07/2019  . Positive GBS test 05/02/2019  . Obesity affecting pregnancy, antepartum 10/18/2018  . Encounter for supervision of normal pregnancy 10/17/2018    Medications Tona E. Pricilla Holm had no medications administered during this visit. Current Outpatient Medications  Medication Sig Dispense Refill  . ibuprofen (ADVIL) 600 MG tablet Take 1 tablet (600 mg total) by mouth every 6 (six) hours. 30 tablet 0  . acetaminophen (TYLENOL) 325 MG tablet Take 2 tablets (650 mg total) by mouth every 4 (four) hours as needed (for pain scale < 4). (Patient not taking: Reported on 06/06/2019) 30 tablet 0  . Blood Pressure Monitor KIT 1 kit by Does not apply route once a week. CHECK BP WEEKLY.  LARGE CUFF  DX:  Z13.6         Z34.86 (Patient not taking: Reported on 06/06/2019) 1 kit 0  . norethindrone (ORTHO MICRONOR) 0.35 MG tablet Take 1 tablet (0.35 mg total) by mouth daily. (Patient not taking: Reported on 06/06/2019) 1 Package 11  . Prenatal 27-1 MG TABS Take 1 tablet by mouth daily. (Patient not taking: Reported on 06/06/2019) 30 tablet 11   No current facility-administered medications for this visit.    Allergies Bactrim [sulfamethoxazole-trimethoprim]  Physical Exam:  LMP 08/02/2018 (Approximate)  General:  Alert, oriented and cooperative. Patient is in no acute distress.  Mental Status: Normal mood and affect. Normal behavior. Normal judgment and thought content.   Respiratory: Normal respiratory effort noted, no problems with respiration noted  Rest of physical exam deferred due to type of encounter  PP Depression Screening:   Edinburgh Postnatal Depression Scale Screening Tool 06/06/2019 05/08/2019 05/08/2019  I have been  able to laugh and see the funny side of things. 0 0 (No Data)  I have looked forward with enjoyment to things. 0 0 -  I have blamed myself unnecessarily when things went wrong. 0 0 -  I have been anxious or worried for no good reason. 0 0 -  I  have felt scared or panicky for no good reason. 0 0 -  Things have been getting on top of me. 0 0 -  I have been so unhappy that I have had difficulty sleeping. 0 0 -  I have felt sad or miserable. 0 0 -  I have been so unhappy that I have been crying. 0 0 -  The thought of harming myself has occurred to me. 0 0 -  Edinburgh Postnatal Depression Scale Total 0 0 -     Assessment:Patient is a 29 y.o. G1P1001 who is 4 weeks postpartum from a normal spontaneous vaginal delivery.  She is doing well.   Plan: 1. Postpartum examination following vaginal delivery --Doing well, good support at home. --Some pain at laceration site with BM, has not had intercourse --Sitz bath PRN, Colace PRN  2. Encounter for counseling regarding contraception --Pt desires POPs, lactation recommended waiting until breastfeeding well established.  Discussed with pt, informed decision-making. Rx sent for Micronor, pt to start in a few weeks as desired.  Condoms for intercourse before starting pills.  3. Gastroesophageal reflux disease with esophagitis without hemorrhage --Pt with less heartburn than during pregnancy, but still has epigastric pain after eating that radiates to her mid back. Likely esophageal spasms.  No associated chest pain or shortness of breath.  Will treat with short course of PPI, then can treat PRN with Pepcid.  - pantoprazole (PROTONIX) 40 MG tablet; Take 1 tablet (40 mg total) by mouth daily.  Dispense: 14 tablet; Refill: 0 There are no diagnoses linked to this encounter.  RTC 1 year  I discussed the assessment and treatment plan with the patient. The patient was provided an opportunity to ask questions and all were answered. The patient agreed with the plan and demonstrated an understanding of the instructions.   The patient was advised to call back or seek an in-person evaluation/go to the ED for any concerning postpartum symptoms.  I provided 10 minutes of face-to-face time during this  encounter.   Fatima Blank, [facility] [employer], [place]

## 2019-08-23 ENCOUNTER — Other Ambulatory Visit: Payer: Self-pay

## 2019-08-24 ENCOUNTER — Encounter: Payer: Self-pay | Admitting: Obstetrics and Gynecology

## 2019-08-24 ENCOUNTER — Ambulatory Visit (INDEPENDENT_AMBULATORY_CARE_PROVIDER_SITE_OTHER): Payer: Self-pay | Admitting: Obstetrics and Gynecology

## 2019-08-24 VITALS — BP 118/68 | Ht 60.0 in | Wt 194.0 lb

## 2019-08-24 DIAGNOSIS — N83292 Other ovarian cyst, left side: Secondary | ICD-10-CM

## 2019-08-24 NOTE — Progress Notes (Signed)
Tara Mathews 1990-09-07 [id number]  SUBJECTIVE:  29 y.o. G1P1001 female presents for management recommendations regarding an incidentally discovered left ovarian cyst.  She just had a baby boy born in January.  Normal vaginal delivery.  She had preeclampsia and a shoulder dystocia.  She is breast-feeding now.  She was having midline upper abdomen pain during her pregnancy and then it started to radiate to her back and made it hard to breathe.  She tried some antiacid medications which only helped a little, then the pain started to radiate over to the right after her pregnancy, and her primary care provider ordered an abdominal ultrasound to check for fatty liver after some noting abnormalities on lab work (we do not have these records available to [place] to review).  She says they did find gallstones and also they discovered the left ovarian cyst during her abdominal ultrasound. To further evaluate the left ovarian cyst, she had a CT of the abdomen and pelvis performed at the [facility] on 08/10/2019 which indicated a left adnexal mass 6.7 x 4.5 cm.  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  She is not having any pelvic pain or abnormal bleeding.  She did bring her CT imaging report and a CD copy of the images today.  Current Outpatient Medications  Medication Sig Dispense Refill  . pantoprazole (PROTONIX) 40 MG tablet Take 1 tablet (40 mg total) by mouth daily. 14 tablet 0  . Prenatal 27-1 MG TABS Take 1 tablet by mouth daily. 30 tablet 11   No current facility-administered medications for this visit.   Allergies: Bactrim [sulfamethoxazole-trimethoprim]  Patient's last menstrual period was 08/01/2018.  Past medical history,surgical history, problem list, medications, allergies, family history and social history were all reviewed and documented as reviewed in the EPIC chart.  ROS:  Feeling well. No dyspnea or chest pain on exertion.   No abdominal pain, change in bowel habits, black or bloody stools.  No urinary tract symptoms. GYN ROS: normal menses, no abnormal bleeding, pelvic pain or discharge, no breast pain or new or enlarging lumps on self exam. No neurological complaints.   OBJECTIVE:  BP 118/68   Ht 5' (1.524 m)   Wt 194 lb (88 kg)   LMP 08/01/2018   BMI 37.89 kg/m  The patient appears well, alert, oriented x 3, in no distress. Abdomen: Soft, nontender, nondistended Pelvic exam deferred to future visit  ASSESSMENT:  29 y.o. 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   Dermoid cysts will not spontaneously resolve as follicular cysts often do. Dermoid cysts often can continue to grow.  Having a medium to large cyst on the ovary does present a potential risk for ovarian torsion and/or sudden rupture of the cyst, either of which could cause acute onset of pain and possible loss of ovary and/or fallopian tube.  Ovarian torsion is considered to be a surgical emergency.  Cyst rupture is not highly common with a dermoid but it can also happen and cause acute onset of pain which often will need surgical management.    She could follow expectant management since she is asymptomatic at this time, however, I strongly recommended proceeding with surgical excision of the cyst given its size and risk for the complications as discussed above.  I would recommend removal due to the concerns of  potential for cyst rupture, ovarian torsion, and much more rarely any sort of malignant process occurring within the cyst.  I would plan to perform operative laparoscopy with removal of the dermoid cyst which possibly could also require unilateral salpingo-oophorectomy depending on how scarred the cyst may be into the area.  Rupture of the cyst during laparoscopy is almost always inevitable and can cause  chemical peritonitis and irritation, but thorough irrigation is performed to try to reduce this outcome as much as possible.  I discussed some of the expectations of laparoscopic surgery including feeling of bloating, abdominal discomfort and possibly referred pain into the shoulders after surgery.  Postoperative recovery would require at least 4-6 weeks of rest from refraining from any heavy physical activity.  The plan would be for a same-day procedure unless there is any complication.  There is always the risk of needing to convert to laparotomy in the case of any intra-abdominal bleeding and/or organ injury or inability to remove the cyst laparoscopically and the patient understands this and would be okay with laparotomy in that case.  Risks of surgery include infection, bleeding possibly requiring blood transfusion, risk of injury to internal organs, including bowel, bladder, major pelvic vessels, ureter, and nerves.  General anesthesia also has risks.    Today she will collect tumor markers including CA-125, AFP, beta hCG, LDH.  We discussed that if normal, these do not completely rule out the possibility of a malignant tumor.  She will consider having the surgery but has financial concerns with insurance at this time, and it also sounds like has been told that she needs her gallbladder out.  If financially feasible and general surgery is okay with it we could potentially even look into doing a combined procedure if possible.  We can have office staff contact her to discuss the financial aspects of surgery.  She will plan to follow-up with [place] within the next 2 months to let us know how she would like to proceed.   Joseph Pierini MD 08/24/19

## 2019-08-24 NOTE — Patient Instructions (Signed)
Diagnostic Laparoscopy Diagnostic laparoscopy is a procedure to diagnose diseases in the abdomen. It might be done for a variety of reasons, such as to look for scar tissue, cancer, or a reason for abdomen (abdominal) pain. During the procedure, a thin, flexible tube that has a light and a camera on the end (laparoscope) is inserted through an incision in the abdomen. The image from the camera is shown on a monitor to help your surgeon see inside your body. Tell a health care provider about:  Any allergies you have.  All medicines you are taking, including vitamins, herbs, eye drops, creams, and over-the-counter medicines.  Any problems you or family members have had with anesthetic medicines.  Any blood disorders you have.  Any surgeries you have had.  Any medical conditions you have. What are the risks? Generally, this is a safe procedure. However, problems may occur, including:  Infection.  Bleeding.  Allergic reactions to medicines or dyes.  Damage to abdominal structures or organs, such as the intestines, liver, stomach, or spleen. What happens before the procedure? Medicines  Ask your health care provider about: ? Changing or stopping your regular medicines. This is especially important if you are taking diabetes medicines or blood thinners. ? Taking medicines such as aspirin and ibuprofen. These medicines can thin your blood. Do not take these medicines unless your health care provider tells you to take them. ? Taking over-the-counter medicines, vitamins, herbs, and supplements.  You may be given antibiotic medicine to help prevent infection. Staying hydrated Follow instructions from your health care provider about hydration, which may include:  Up to 2 hours before the procedure - you may continue to drink clear liquids, such as water, clear fruit juice, black coffee, and plain tea. Eating and drinking restrictions Follow instructions from your health care provider  about eating and drinking, which may include:  8 hours before the procedure - stop eating heavy meals or foods such as meat, fried foods, or fatty foods.  6 hours before the procedure - stop eating light meals or foods, such as toast or cereal.  6 hours before the procedure - stop drinking milk or drinks that contain milk.  2 hours before the procedure - stop drinking clear liquids. General instructions  Ask your health care provider how your surgical site will be marked or identified.  You may be asked to shower with a germ-killing soap.  Plan to have someone take you home from the hospital or clinic.  Plan to have a responsible adult care for you for at least 24 hours after you leave the hospital or clinic. This is important. What happens during the procedure?   To lower your risk of infection: ? Your health care team will wash or sanitize their hands. ? Hair may be removed from the surgical area. ? Your skin will be washed with soap.  An IV will be inserted into one of your veins.  You will be given a medicine to make you fall asleep (general anesthetic). You may also be given a medicine to help you relax (sedative).  A breathing tube will be placed down your throat to help you breathe during the procedure.  Your abdomen will be filled with an air-like gas so it expands. This will give the surgeon more room to operate and will make your organs easier to see.  Many small incisions will be made in your abdomen.  A laparoscope and other surgical instruments will be inserted into your abdomen through the   incisions.  A tissue sample may be removed from an organ for examination (biopsy). This will depend on the reason why you are having this procedure.  The laparoscope and other instruments will be removed from your abdomen.  The gas will be released.  Your incisions will be closed with stitches (sutures) and covered with a bandage (dressing).  Your breathing tube will be  removed. The procedure may vary among health care providers and hospitals. What happens after the procedure?   Your blood pressure, heart rate, breathing rate, and blood oxygen level will be monitored until the medicines you were given have worn off.  Do not drive for 24 hours if you were given a sedative during your procedure.  It is up to you to get the results of your procedure. Ask your health care provider, or the department that is doing the procedure, when your results will be ready. Summary  Diagnostic laparoscopy is a way to look for problems in the abdomen using small incisions.  Follow instructions from your health care provider about how to prepare for the procedure.  Plan to have a responsible adult care for you for at least 24 hours after you leave the hospital or clinic. This is important. This information is not intended to replace advice given to you by your health care provider. Make sure you discuss any questions you have with your health care provider. Document Revised: 03/11/2017 Document Reviewed: 09/22/2016 Elsevier Patient Education  2020 [place].  Ovarian Cystectomy Ovarian cystectomy is a procedure that is done to remove a fluid-filled sac (cyst) on an ovary. The ovaries are small organs that produce eggs in women. Various types of cysts can form on the ovaries. Most are not cancerous. This procedure may be done for cysts that are large, cause symptoms, or do not go away on their own. It may also be done for a cyst that is cancerous or might be cancerous. This surgery can be done using a laparoscopic technique or an open abdominal technique. The laparoscopic technique is minimally invasive and results in smaller incisions and a faster recovery. The technique used will depend on your age, the type of cyst that you have, and whether the cyst is cancerous. The laparoscopic technique is not used for a cancerous cyst. Tell a health care provider about:  Any  allergies you have.  All medicines you are taking, including vitamins, herbs, eye drops, creams, and over-the-counter medicines.  Any problems you or family members have had with anesthetic medicines.  Any blood disorders you have.  Any surgeries you have had.  Any medical conditions you have.  Whether you are pregnant or may be pregnant. What are the risks? Generally, this is a safe procedure. However, problems may occur, including:  Excessive bleeding.  Infection.  Damage to nearby structures or organs.  Allergic reactions to medicines.  Blood clots.  Inability to get pregnant (infertility). What happens before the procedure? Staying hydrated Follow instructions from your health care provider about hydration, which may include:  Up to 2 hours before the procedure - you may continue to drink clear liquids, such as water, clear fruit juice, black coffee, and plain tea.  Eating and drinking restrictions Follow instructions from your health care provider about eating and drinking, which may include:  8 hours before the procedure - stop eating heavy meals or foods, such as meat, fried foods, or fatty foods.  6 hours before the procedure - stop eating light meals or foods, such as  toast or cereal.  6 hours before the procedure - stop drinking milk or drinks that contain milk.  2 hours before the procedure - stop drinking clear liquids. Medicines Ask your health care provider about:  Changing or stopping your regular medicines. This is especially important if you are taking diabetes medicines or blood thinners.  Taking medicines such as aspirin and ibuprofen. These medicines can thin your blood. Do not take these medicines unless your health care provider tells you to take them.  Taking over-the-counter medicines, vitamins, herbs, and supplements. General instructions  Do not use any products that contain nicotine or tobacco for at least 4 weeks before the procedure.  These products include cigarettes, e-cigarettes, and chewing tobacco. If you need help quitting, ask your health care provider.  Ask your health care provider: ? How your surgery site will be marked. ? What steps will be taken to help prevent infection. These may include:  Removing hair at the surgery site.  Washing skin with a germ-killing soap.  Taking antibiotic medicine.  You may be asked to shower with a germ-killing soap.  Plan to have someone take you home from the hospital or clinic.  Plan to have someone help with household activities for 1-2 weeks after the procedure.  Let your health care provider know if you develop a cold or any infection before your surgery. What happens during the procedure?  An IV will be inserted into one of your veins.  You will be given one or more of the following: ? A medicine to help you relax (sedative). ? A medicine to make you fall asleep (general anesthetic).  Small monitors will be attached to your body. They will be used to check your heart, blood pressure, and oxygen level.  A breathing tube will be placed into your lungs during the procedure.  Your surgeon will do the surgery using either the laparoscopic technique or the open abdominal technique. Laparoscopic technique   Several small incisions will be made in your abdomen.  Your abdomen will be filled with carbon dioxide gas to make it expand. This will give the surgeon more room to operate. It will also make your organs easier to see.  A thin scope with a camera (laparoscope) will be put through one of the small incisions. The laparoscope will send a picture to a monitor in the operating room to help the surgeon see inside your body.  Hollow tubes will be put through the other small incisions in your abdomen. The tools needed for the procedure will be put through these tubes.  The ovary with the cyst will be identified, and the cyst will be removed.  The tools will then be  removed, and the incisions will be closed with stitches or skin glue. Bandages (dressings) may be applied. Open abdominal technique  A single, large incision will be made along your bikini line or in the middle of your lower abdomen.  The ovary with the cyst will be identified, and the cyst will be removed.  The incision will then be closed with stitches or staples.  Bandages (dressings) may be applied. The procedure may vary among health care providers and hospitals. What happens after the procedure?  Your blood pressure, heart rate, breathing rate, and blood oxygen level will be monitored until you leave the hospital or clinic.  Your IV will be removed after you are able to eat and drink well.  You may be given medicine for pain or to help you sleep.  You may be given an antibiotic medicine.  Do not drive for 24 hours if you were given a sedative during the procedure.  The cyst that was removed will be sent to the lab for testing. If the cyst has cancer cells, both ovaries may need to be removed during a different surgery.  It is up to you to get the results of your procedure. Ask your health care provider, or the department that is doing the procedure, when your results will be ready. Summary  Ovarian cystectomy is a procedure that is done to remove a cyst on an ovary.  This procedure may be done for cysts that are large, cause symptoms, or do not go away on their own. It may also be done for a cyst that is cancerous or might be cancerous.  Follow instructions from your health care provider about eating and drinking before the procedure.  After the cyst is removed, it will be sent to the lab for testing. This information is not intended to replace advice given to you by your health care provider. Make sure you discuss any questions you have with your health care provider. Document Revised: 10/27/2018 Document Reviewed: 10/27/2018 Elsevier Patient Education  [place].  Ovarian Cyst     An ovarian cyst is a fluid-filled sac that forms on an ovary. The ovaries are small organs that produce eggs in women. Various types of cysts can form on the ovaries. Some may cause symptoms and require treatment. Most ovarian cysts go away on their own, are not cancerous (are benign), and do not cause problems. Common types of ovarian cysts include:  Functional (follicle) cysts. ? Occur during the menstrual cycle, and usually go away with the next menstrual cycle if you do not get pregnant. ? Usually cause no symptoms.  Endometriomas. ? Are cysts that form from the tissue that lines the uterus (endometrium). ? Are sometimes called "chocolate cysts" because they become filled with blood that turns brown. ? Can cause pain in the lower abdomen during intercourse and during your period.  Cystadenoma cysts. ? Develop from cells on the outside surface of the ovary. ? Can get very large and cause lower abdomen pain and pain with intercourse. ? Can cause severe pain if they twist or break open (rupture).  Dermoid cysts. ? Are sometimes found in both ovaries. ? May contain different kinds of body tissue, such as skin, teeth, hair, or cartilage. ? Usually do not cause symptoms unless they get very big.  Theca lutein cysts. ? Occur when too much of a certain hormone (human chorionic gonadotropin) is produced and overstimulates the ovaries to produce an egg. ? Are most common after having procedures used to assist with the conception of a baby (in vitro fertilization). What are the causes? Ovarian cysts may be caused by:  Ovarian hyperstimulation syndrome. This is a condition that can develop from taking fertility medicines. It causes multiple large ovarian cysts to form.  Polycystic ovarian syndrome (PCOS). This is a common hormonal disorder that can cause ovarian cysts, as well as problems with your period or fertility. What increases the risk? The following  factors may make you more likely to develop ovarian cysts:  Being overweight or obese.  Taking fertility medicines.  Taking certain forms of hormonal birth control.  Smoking. What are the signs or symptoms? Many ovarian cysts do not cause symptoms. If symptoms are present, they may include:  Pelvic pain or pressure.  Pain in the lower  abdomen.  Pain during sex.  Abdominal swelling.  Abnormal menstrual periods.  Increasing pain with menstrual periods. How is this diagnosed? These cysts are commonly found during a routine pelvic exam. You may have tests to find out more about the cyst, such as:  Ultrasound.  X-ray of the pelvis.  CT scan.  MRI.  Blood tests. How is this treated? Many ovarian cysts go away on their own without treatment. Your health care provider may want to check your cyst regularly for 2-3 months to see if it changes. If you are in menopause, it is especially important to have your cyst monitored closely because menopausal women have a higher rate of ovarian cancer. When treatment is needed, it may include:  Medicines to help relieve pain.  A procedure to drain the cyst (aspiration).  Surgery to remove the whole cyst.  Hormone treatment or birth control pills. These methods are sometimes used to help dissolve a cyst. Follow these instructions at home:  Take over-the-counter and prescription medicines only as told by your health care provider.  Do not drive or use heavy machinery while taking prescription pain medicine.  Get regular pelvic exams and Pap tests as often as told by your health care provider.  Return to your normal activities as told by your health care provider. Ask your health care provider what activities are safe for you.  Do not use any products that contain nicotine or tobacco, such as cigarettes and e-cigarettes. If you need help quitting, ask your health care provider.  Keep all follow-up visits as told by your health care  provider. This is important. Contact a health care provider if:  Your periods are late, irregular, or painful, or they stop.  You have pelvic pain that does not go away.  You have pressure on your bladder or trouble emptying your bladder completely.  You have pain during sex.  You have any of the following in your abdomen: ? A feeling of fullness. ? Pressure. ? Discomfort. ? Pain that does not go away. ? Swelling.  You feel generally ill.  You become constipated.  You lose your appetite.  You develop severe acne.  You start to have more body hair and facial hair.  You are gaining weight or losing weight without changing your exercise and eating habits.  You think you may be pregnant. Get help right away if:  You have abdominal pain that is severe or gets worse.  You cannot eat or drink without vomiting.  You suddenly develop a fever.  Your menstrual period is much heavier than usual. This information is not intended to replace advice given to you by your health care provider. Make sure you discuss any questions you have with your health care provider. Document Revised: 06/27/2017 Document Reviewed: 08/31/2015 Elsevier Patient Education  2020 [employer].

## 2019-08-27 ENCOUNTER — Encounter: Payer: Self-pay | Admitting: Obstetrics and Gynecology

## 2019-08-27 LAB — HCG, QUANTITATIVE, PREGNANCY: HCG, Total, QN: <3 m[IU]/mL

## 2019-08-27 LAB — CA 125: CA 125: 34 U/mL (ref <35)

## 2019-08-27 LAB — AFP TUMOR MARKER: AFP-Tumor Marker: 5.3 ng/mL

## 2019-08-27 LAB — LACTATE DEHYDROGENASE: LDH: 137 U/L (ref 100–200)

## 2019-10-10 ENCOUNTER — Encounter: Payer: Self-pay | Admitting: Nurse Practitioner

## 2019-10-10 ENCOUNTER — Ambulatory Visit: Payer: Self-pay | Attending: Nurse Practitioner | Admitting: Nurse Practitioner

## 2019-10-10 DIAGNOSIS — Z7689 Persons encountering health services in other specified circumstances: Secondary | ICD-10-CM

## 2019-10-10 DIAGNOSIS — D72829 Elevated white blood cell count, unspecified: Secondary | ICD-10-CM

## 2019-10-10 DIAGNOSIS — N83292 Other ovarian cyst, left side: Secondary | ICD-10-CM

## 2019-10-10 DIAGNOSIS — K2100 Gastro-esophageal reflux disease with esophagitis, without bleeding: Secondary | ICD-10-CM

## 2019-10-10 MED ORDER — OMEPRAZOLE 40 MG PO CPDR: 40.0000 mg | DELAYED_RELEASE_CAPSULE | Freq: Every day | ORAL | 0 refills | Status: DC

## 2019-10-10 NOTE — Progress Notes (Signed)
Virtual Visit via Telephone Note Due to national recommendations of social distancing due to [place] 19, telehealth visit is felt to be most appropriate for this patient at this time.  I discussed the limitations, risks, security and privacy concerns of performing an evaluation and management service by telephone and the availability of in person appointments. I also discussed with the patient that there may be a patient responsible charge related to this service. The patient expressed understanding and agreed to proceed.    I connected with [name] on 10/10/19  at   1:50 PM EDT  EDT by telephone and verified that I am speaking with the correct person using two identifiers.   Consent I discussed the limitations, risks, security and privacy concerns of performing an evaluation and management service by telephone and the availability of in person appointments. I also discussed with the patient that there may be a patient responsible charge related to this service. The patient expressed understanding and agreed to proceed.  Still taking protonix. Still with burning sensation in the pa  Has complex cyst of left ovary. Needs surgical removal. Endorses right sided upper quadrant abdominal pain. Shooting pain. Cant breathe. Last about an hour.    Location of Patient: Private Residence   Location of Provider: [place] [facility] [place] participating in Telemedicine visit: Geryl Rankins FNP-BC [place]    History of Present Illness: Telemedicine visit for: Establish Care  She has a complex left ovarian cyst which needs to be surgically removed per her previous GYN's notes on 08-24-2019. She is establishing care here with [facility] today in order to apply for the [place] program and be referred to a gynecologist who accepts the financial assistance.  She does endorse left-sided pelvic pain which is increased during her menstrual cycles. CT of  the abdomen and pelvis performed at the [facility] on 08/10/2019 indicated a left adnexal mass 6.7 x 4.5 cm.  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 Per GYN note: I strongly recommended proceeding with surgical excision of the cyst given its size and risk for the complications as discussed above. I would recommend removal due to the concerns of potential for cyst rupture, ovarian torsion, and much more rarely any sort of malignant process occurring within the cyst. I would plan to perform operative laparoscopy with removal of the dermoid cyst which possibly could also require unilateral salpingo-oophorectomy depending on how scarred the cyst may be into the area.   Abdominal US performed during her pregnancy did also reveal a fatty liver and gallstones per patient report.  She is currently taking pantoprazole for acid reflux.  States initially pantoprazole provided complete relief of her symptoms of epigastric pain and burning however at this time she does not feel it is as effective as when she first started taking it.  Experiencing increased burning in the epigastric area as well as right upper quadrant.  Unfortunately I am unable to view her imaging.  She will need to be referred to general surgery for evaluation of her gallbladder.     Past Medical History:  Diagnosis Date   Anxiety     Past Surgical History:  Procedure Laterality Date   NO PAST SURGERIES      Family History  Problem Relation Age of Onset   Miscarriages / Stillbirths Maternal Aunt    Cancer Maternal Uncle  Brain tumor   Diabetes Paternal Grandmother    Obesity Mother    Arthritis Father    Hypertension Father    Obesity Father     Social History   Socioeconomic History   Marital status: Single    Spouse name: Not on file   Number of children: Not on file   Years of education: Not on file   Highest education level: Not on file   Occupational History   Occupation: UNEMPLOYED  Tobacco Use   Smoking status: Never Smoker   Smokeless tobacco: Never Used  Scientific laboratory technician Use: Never used  Substance and Sexual Activity   Alcohol use: No   Drug use: No   Sexual activity: Yes    Birth control/protection: Condom    Comment: 1st intercourse 29 yo-Fewer than 5 partners  Other Topics Concern   Not on file  Social History Narrative   Not on file   Social Determinants of Health   Financial Resource Strain:    Difficulty of Paying Living Expenses:   Food Insecurity:    Worried About Charity fundraiser in the Last Year:    Arboriculturist in the Last Year:   Transportation Needs:    Film/video editor (Medical):    Lack of Transportation (Non-Medical):   Physical Activity:    Days of Exercise per Week:    Minutes of Exercise per Session:   Stress:    Feeling of Stress :   Social Connections:    Frequency of Communication with Friends and Family:    Frequency of Social Gatherings with Friends and Family:    Attends Religious Services:    Active Member of Clubs or Organizations:    Attends Music therapist:    Marital Status:      Observations/Objective: Awake, alert and oriented x 3   Review of Systems  Constitutional: Negative for fever, malaise/fatigue and weight loss.  HENT: Negative.  Negative for nosebleeds.   Eyes: Negative.  Negative for blurred vision, double vision and photophobia.  Respiratory: Negative.  Negative for cough and shortness of breath.   Cardiovascular: Negative.  Negative for chest pain, palpitations and leg swelling.  Gastrointestinal: Positive for abdominal pain and heartburn. Negative for nausea and vomiting.       SEE HPI  Musculoskeletal: Negative.  Negative for myalgias.  Neurological: Negative.  Negative for dizziness, focal weakness, seizures and headaches.  Psychiatric/Behavioral: Negative.  Negative for suicidal ideas.     Assessment and Plan: Eliette was seen today for establish care.  Diagnoses and all orders for this visit:  Encounter to establish care Patient has been advised to apply for financial assistance and schedule to see our financial counselor.    Complex cyst of left ovary -     Ambulatory referral to Gynecology; Future  Gastroesophageal reflux disease with esophagitis without hemorrhage -     omeprazole (PRILOSEC) 40 MG capsule; Take 1 capsule (40 mg total) by mouth daily. -     CBC; Future -     CMP14+EGFR; Future INSTRUCTIONS: Avoid GERD Triggers: acidic, spicy or fried foods, caffeine, coffee, sodas,  alcohol and chocolate.   Leukocytosis, unspecified type -     CBC; Future     Follow Up Instructions Return in about 6 weeks (around 11/21/2019).     I discussed the assessment and treatment plan with the patient. The patient was provided an opportunity to ask questions and all were answered. The patient  agreed with the plan and demonstrated an understanding of the instructions.   The patient was advised to call back or seek an in-person evaluation if the symptoms worsen or if the condition fails to improve as anticipated.  I provided 17 minutes of non-face-to-face time during this encounter including median intraservice time, reviewing previous notes, labs, imaging, medications and explaining diagnosis and management.  Gildardo Pounds, FNP-BC

## 2019-10-11 ENCOUNTER — Other Ambulatory Visit: Payer: Self-pay

## 2019-10-11 ENCOUNTER — Ambulatory Visit: Payer: Self-pay | Attending: Nurse Practitioner

## 2019-10-11 DIAGNOSIS — K2100 Gastro-esophageal reflux disease with esophagitis, without bleeding: Secondary | ICD-10-CM

## 2019-10-11 DIAGNOSIS — D72829 Elevated white blood cell count, unspecified: Secondary | ICD-10-CM

## 2019-10-12 ENCOUNTER — Encounter: Payer: Self-pay | Admitting: Nurse Practitioner

## 2019-10-12 LAB — CMP14+EGFR
ALT: 32 IU/L (ref 0–32)
AST: 19 IU/L (ref 0–40)
Albumin/Globulin Ratio: 1.4 (ref 1.2–2.2)
Albumin: 4.3 g/dL (ref 3.9–5.0)
Alkaline Phosphatase: 111 IU/L (ref 48–121)
BUN/Creatinine Ratio: 31 — ABNORMAL HIGH (ref 9–23)
BUN: 20 mg/dL (ref 6–20)
Bilirubin Total: 0.3 mg/dL (ref 0.0–1.2)
CO2: 20 mmol/L (ref 20–29)
Calcium: 9.8 mg/dL (ref 8.7–10.2)
Chloride: 106 mmol/L (ref 96–106)
Creatinine, Ser: 0.65 mg/dL (ref 0.57–1.00)
GFR calc Af Amer: 141 mL/min/{1.73_m2} (ref >59)
GFR calc non Af Amer: 122 mL/min/{1.73_m2} (ref >59)
Globulin, Total: 3.1 g/dL (ref 1.5–4.5)
Glucose: 94 mg/dL (ref 65–99)
Potassium: 4.5 mmol/L (ref 3.5–5.2)
Sodium: 142 mmol/L (ref 134–144)
Total Protein: 7.4 g/dL (ref 6.0–8.5)

## 2019-10-12 LAB — CBC
Hematocrit: 43.4 % (ref 34.0–46.6)
Hemoglobin: 14.3 g/dL (ref 11.1–15.9)
MCH: 28.8 pg (ref 26.6–33.0)
MCHC: 32.9 g/dL (ref 31.5–35.7)
MCV: 87 fL (ref 79–97)
Platelets: 364 10*3/uL (ref 150–450)
RBC: 4.97 x10E6/uL (ref 3.77–5.28)
RDW: 12 % (ref 11.7–15.4)
WBC: 8.1 10*3/uL (ref 3.4–10.8)

## 2019-11-19 ENCOUNTER — Other Ambulatory Visit: Payer: Self-pay

## 2019-11-19 ENCOUNTER — Ambulatory Visit: Payer: Self-pay

## 2019-12-04 ENCOUNTER — Encounter: Payer: Self-pay | Admitting: Nurse Practitioner

## 2019-12-05 ENCOUNTER — Other Ambulatory Visit: Payer: Self-pay | Admitting: Nurse Practitioner

## 2019-12-05 DIAGNOSIS — N83202 Unspecified ovarian cyst, left side: Secondary | ICD-10-CM

## 2020-02-12 ENCOUNTER — Encounter: Payer: Self-pay | Admitting: Nurse Practitioner

## 2020-03-12 ENCOUNTER — Ambulatory Visit (INDEPENDENT_AMBULATORY_CARE_PROVIDER_SITE_OTHER): Payer: Self-pay | Admitting: Obstetrics and Gynecology

## 2020-03-12 ENCOUNTER — Encounter: Payer: Self-pay | Admitting: Obstetrics and Gynecology

## 2020-03-12 ENCOUNTER — Other Ambulatory Visit: Payer: Self-pay

## 2020-03-12 DIAGNOSIS — N63 Unspecified lump in unspecified breast: Secondary | ICD-10-CM

## 2020-03-12 DIAGNOSIS — D271 Benign neoplasm of left ovary: Secondary | ICD-10-CM

## 2020-03-12 DIAGNOSIS — N643 Galactorrhea not associated with childbirth: Secondary | ICD-10-CM | POA: Insufficient documentation

## 2020-03-12 NOTE — Progress Notes (Signed)
Pt c/o bilateral low abdominal pain presents for f/u ovarian cyst found on 07/2019 u/s at [place]  She was told by previous physician she will need surgery  Pt also c/o L breast pain and she's still lactating after  discontinuing breast feeding 5 months ago

## 2020-03-12 NOTE — Progress Notes (Signed)
   Subjective:    Patient ID: Tara Mathews, female    DOB: 12-08-1990, 29 y.o.   MRN: [id number] CC: ovarian mass and breast mass HPI  29 yo G1P1 seen at [facility] office with concern of left ovarian mass.  Pt was worked up by outside providers and received a CT scan which suggested a left dermoid cyst sized 6.7 x 4.5 cm.  Pt has not had further follow up or treatment since then. She notes mild lower abdominal pain and bloating.  Pt has also noted  1 month history of bilateral nipple leakage with a small knot in the left breast.  Per pt she stopped breast feeding 5 months ago.    Review of Systems  Constitutional: Negative.   HENT: Negative.   Respiratory: Negative.   Cardiovascular: Negative.   Gastrointestinal: Positive for abdominal pain. Negative for diarrhea and nausea.  Endocrine:       Leaking nipples  Musculoskeletal: Negative.   Neurological: Negative.   Psychiatric/Behavioral: Negative.        Objective:   Physical Exam Vitals reviewed.  Constitutional:      Appearance: Normal appearance. She is obese.  HENT:     Head: Normocephalic and atraumatic.  Cardiovascular:     Rate and Rhythm: Normal rate and regular rhythm.     Heart sounds: Normal heart sounds.  Pulmonary:     Effort: Pulmonary effort is normal.     Breath sounds: Normal breath sounds.  Abdominal:     General: Abdomen is flat.     Palpations: Abdomen is soft.  Genitourinary:    Comments: SVE: uterus WNL, no adnexal mass or pain Neurological:     Mental Status: She is alert.   Breast exam: small nodule felt in left breast at 10 o'clock Vitals:   03/12/20 1442  BP: 122/84  Pulse: 85         Assessment & Plan:   1. Dermoid cyst of ovary, left Will schedule operative laparoscopy for possible cystectomy, preop shortly before procedure.  Per pt the procedure needs to be scheduled before February 4 due to payment assistance waning. - US PELVIC COMPLETE WITH TRANSVAGINAL; Future  2.  Breast mass in female U/s to localize any masses - US BREAST LTD UNI LEFT INC AXILLA; Future  3. Galactorrhea in female Check TSH and prolactin for further eval - TSH - Prolactin  Once procedure is scheduled, will set up preop appointment.  Griffin Basil, MD Faculty Attending, Center for [facility]

## 2020-03-12 NOTE — Patient Instructions (Addendum)
Mature teratoma (dermoid)--  Most teratomas are cystic and composed of mature differentiated elements (mature); they are better known as dermoid cysts. The mature cystic teratoma accounts for more than 95 percent of all ovarian teratomas and is almost invariably benign [13]. Dermoid cysts are the most common ovarian tumor in women in the second and third decade of life. Mature teratomas at nonovarian sites (eg, vaginal wall) have also been described [14]. In rare instances, a teratoma is solid but is composed entirely of benign-appearing heterogeneous collections of tissue and organized structures derived from all three cell layers. Most mature solid teratomas are unilateral and benign, although peritoneal implants have been described. Grossly, it may be difficult/impossible to differentiate these neoplasms from malignant solid immature teratomas, which are almost always solid, and they therefore may require sampling from multiple sites (see 'Immature teratoma' below). Management is as described above for mature cystic teratomas  Diagnostic Laparoscopy Diagnostic laparoscopy is a procedure to diagnose diseases in the abdomen. It might be done for a variety of reasons, such as to look for scar tissue, cancer, or a reason for abdomen (abdominal) pain. During the procedure, a thin, flexible tube that has a light and a camera on the end (laparoscope) is inserted through an incision in the abdomen. The image from the camera is shown on a monitor to help your surgeon see inside your body. Tell a health care provider about:  Any allergies you have.  All medicines you are taking, including vitamins, herbs, eye drops, creams, and over-the-counter medicines.  Any problems you or family members have had with anesthetic medicines.  Any blood disorders you have.  Any surgeries you have had.  Any medical conditions you have. What are the risks? Generally, this is a safe procedure. However, problems may  occur, including:  Infection.  Bleeding.  Allergic reactions to medicines or dyes.  Damage to abdominal structures or organs, such as the intestines, liver, stomach, or spleen. What happens before the procedure? Medicines  Ask your health care provider about: ? Changing or stopping your regular medicines. This is especially important if you are taking diabetes medicines or blood thinners. ? Taking medicines such as aspirin and ibuprofen. These medicines can thin your blood. Do not take these medicines unless your health care provider tells you to take them. ? Taking over-the-counter medicines, vitamins, herbs, and supplements.  You may be given antibiotic medicine to help prevent infection. Staying hydrated Follow instructions from your health care provider about hydration, which may include:  Up to 2 hours before the procedure - you may continue to drink clear liquids, such as water, clear fruit juice, black coffee, and plain tea. Eating and drinking restrictions Follow instructions from your health care provider about eating and drinking, which may include:  8 hours before the procedure - stop eating heavy meals or foods such as meat, fried foods, or fatty foods.  6 hours before the procedure - stop eating light meals or foods, such as toast or cereal.  6 hours before the procedure - stop drinking milk or drinks that contain milk.  2 hours before the procedure - stop drinking clear liquids. General instructions  Ask your health care provider how your surgical site will be marked or identified.  You may be asked to shower with a germ-killing soap.  Plan to have someone take you home from the hospital or clinic.  Plan to have a responsible adult care for you for at least 24 hours after you leave the hospital  or clinic. This is important. What happens during the procedure?   To lower your risk of infection: ? Your health care team will wash or sanitize their hands. ? Hair  may be removed from the surgical area. ? Your skin will be washed with soap.  An IV will be inserted into one of your veins.  You will be given a medicine to make you fall asleep (general anesthetic). You may also be given a medicine to help you relax (sedative).  A breathing tube will be placed down your throat to help you breathe during the procedure.  Your abdomen will be filled with an air-like gas so it expands. This will give the surgeon more room to operate and will make your organs easier to see.  Many small incisions will be made in your abdomen.  A laparoscope and other surgical instruments will be inserted into your abdomen through the incisions.  A tissue sample may be removed from an organ for examination (biopsy). This will depend on the reason why you are having this procedure.  The laparoscope and other instruments will be removed from your abdomen.  The gas will be released.  Your incisions will be closed with stitches (sutures) and covered with a bandage (dressing).  Your breathing tube will be removed. The procedure may vary among health care providers and hospitals. What happens after the procedure?   Your blood pressure, heart rate, breathing rate, and blood oxygen level will be monitored until the medicines you were given have worn off.  Do not drive for 24 hours if you were given a sedative during your procedure.  It is up to you to get the results of your procedure. Ask your health care provider, or the department that is doing the procedure, when your results will be ready. Summary  Diagnostic laparoscopy is a way to look for problems in the abdomen using small incisions.  Follow instructions from your health care provider about how to prepare for the procedure.  Plan to have a responsible adult care for you for at least 24 hours after you leave the hospital or clinic. This is important. This information is not intended to replace advice given to you  by your health care provider. Make sure you discuss any questions you have with your health care provider. Document Revised: 03/11/2017 Document Reviewed: 09/22/2016 Elsevier Patient Education  [place].

## 2020-03-13 LAB — TSH: TSH: 1.46 u[IU]/mL (ref 0.450–4.500)

## 2020-03-13 LAB — PROLACTIN: Prolactin: 39.2 ng/mL — ABNORMAL HIGH (ref 4.8–23.3)

## 2020-03-14 ENCOUNTER — Other Ambulatory Visit (INDEPENDENT_AMBULATORY_CARE_PROVIDER_SITE_OTHER): Payer: Self-pay | Admitting: Obstetrics and Gynecology

## 2020-03-14 DIAGNOSIS — N643 Galactorrhea not associated with childbirth: Secondary | ICD-10-CM

## 2020-03-14 NOTE — Progress Notes (Signed)
Pt will get fasting prolactin, if still elevated consider cranial MRI and/or referral to endocrinology.

## 2020-03-18 ENCOUNTER — Other Ambulatory Visit: Payer: Self-pay

## 2020-03-18 DIAGNOSIS — N643 Galactorrhea not associated with childbirth: Secondary | ICD-10-CM

## 2020-03-19 ENCOUNTER — Telehealth: Payer: Self-pay

## 2020-03-19 ENCOUNTER — Other Ambulatory Visit: Payer: Self-pay

## 2020-03-19 LAB — PROLACTIN: Prolactin: 27.2 ng/mL — ABNORMAL HIGH (ref 4.8–23.3)

## 2020-03-19 NOTE — Telephone Encounter (Signed)
Telephoned patient at home number. Left a voice message with BCCCP scheduling information.

## 2020-03-20 ENCOUNTER — Other Ambulatory Visit: Payer: Self-pay

## 2020-03-20 DIAGNOSIS — N6320 Unspecified lump in the left breast, unspecified quadrant: Secondary | ICD-10-CM

## 2020-03-26 ENCOUNTER — Ambulatory Visit
Admission: RE | Admit: 2020-03-26 | Discharge: 2020-03-26 | Disposition: A | Discharge status: Home / Self Care | Payer: Self-pay | Source: Ambulatory Visit | Attending: Obstetrics and Gynecology | Admitting: Obstetrics and Gynecology

## 2020-03-26 ENCOUNTER — Other Ambulatory Visit: Payer: Self-pay

## 2020-03-26 DIAGNOSIS — D271 Benign neoplasm of left ovary: Secondary | ICD-10-CM | POA: Insufficient documentation

## 2020-04-03 ENCOUNTER — Ambulatory Visit: Payer: No Typology Code available for payment source | Admitting: *Deleted

## 2020-04-03 ENCOUNTER — Other Ambulatory Visit: Payer: Self-pay

## 2020-04-03 ENCOUNTER — Ambulatory Visit: Payer: Self-pay

## 2020-04-03 ENCOUNTER — Ambulatory Visit
Admission: RE | Admit: 2020-04-03 | Discharge: 2020-04-03 | Disposition: A | Discharge status: Home / Self Care | Payer: No Typology Code available for payment source | Source: Ambulatory Visit | Attending: Obstetrics and Gynecology | Admitting: Obstetrics and Gynecology

## 2020-04-03 VITALS — BP 120/88 | Wt 214.5 lb

## 2020-04-03 DIAGNOSIS — N644 Mastodynia: Secondary | ICD-10-CM

## 2020-04-03 DIAGNOSIS — N6320 Unspecified lump in the left breast, unspecified quadrant: Secondary | ICD-10-CM

## 2020-04-03 DIAGNOSIS — Z1239 Encounter for other screening for malignant neoplasm of breast: Secondary | ICD-10-CM

## 2020-04-03 NOTE — Patient Instructions (Signed)
Explained breast self awareness with Tara Mathews. Patient did not need a Pap smear today due to last Pap smear was 10/18/2018. Let her know B[facility]CP will cover Pap smears every 3 years unless has a history of abnormal Pap smears and based on the recommendation from her last Pap smear that her next one is due in July 2023. Referred patient to the [facility] for a left breast ultrasound. Appointment scheduled Thursday, April 03, 2020 at 1010. Patient aware of appointment and will be there. Tara Mathews verbalized understanding.  Kaila Devries, Arvil Chaco, RN 10:15 AM

## 2020-04-03 NOTE — Progress Notes (Signed)
[name] is a 29 y.o. female who presents to [facility] clinic today with complaint of left breast lump and pain x 5 months. Patient stated the pain comes and goes. Patient rates the pain at a 8 out of 10. Patient stated she quit breastfeeding 5 months ago and still has some breast milk. Patient is being followed by Dr. Elgie Congo at the [facility] for [place] for elevated prolactin.    Pap Smear: Pap smear not completed today. Last Pap smear was 10/18/2018 at [facility] and was normal with positive HPV. Per recommendation by Dr. Roselie Awkward on 10/27/2018 next Pap smear due in three years. Per patient has no history of an abnormal Pap smear. Last Pap smear result is available in Epic.   Physical exam: Breasts Breasts symmetrical. No skin abnormalities bilateral breasts. No nipple retraction bilateral breasts. No nipple discharge bilateral breasts. Unable to express any discharge on exam. No lymphadenopathy. No lumps palpated bilateral breasts. Unable to palpate a lump in patients area of concern. Complaints of diffuse left breast pain on exam that was greater within the left inner breast.       Pelvic/Bimanual Pap is not indicated today per BCCCP guidelines.    Smoking History: Patient has never smoked.   Patient Navigation: Patient education provided. Access to services provided for patient through BCCCP program.    Breast and Cervical Cancer Risk Assessment: Patient does not have family history of breast cancer, known genetic mutations, or radiation treatment to the chest before age 52. Patient does not have history of cervical dysplasia, immunocompromised, or DES exposure in-utero. Breast cancer risk assessment completed. No breast cancer risk calculated due to patient is less than 53 years old.  Risk Assessment    Risk Scores      04/03/2020   Last edited by: Demetrius Revel, LPN   5-year risk:    Lifetime risk:            A: BCCCP exam without pap smear Complaint of left breast lump and pain.  P: Referred patient to the [facility] for a left breast ultrasound. Appointment scheduled Thursday, April 03, 2020 at 1010.  Loletta Parish, RN 04/03/2020 10:15 AM

## 2020-04-22 ENCOUNTER — Telehealth: Payer: Self-pay

## 2020-04-22 NOTE — Telephone Encounter (Signed)
Called to notify patient that surgery had to be cancelled/rescheduled due to COVID surge, no answer, left voicemail.

## 2020-04-23 ENCOUNTER — Other Ambulatory Visit: Payer: Self-pay

## 2020-04-23 ENCOUNTER — Ambulatory Visit (INDEPENDENT_AMBULATORY_CARE_PROVIDER_SITE_OTHER): Payer: Self-pay | Admitting: Obstetrics and Gynecology

## 2020-04-23 ENCOUNTER — Encounter: Payer: Self-pay | Admitting: Obstetrics and Gynecology

## 2020-04-23 ENCOUNTER — Inpatient Hospital Stay (HOSPITAL_COMMUNITY): Admission: RE | Admit: 2020-04-23 | Payer: No Typology Code available for payment source | Source: Ambulatory Visit

## 2020-04-23 VITALS — BP 121/83 | HR 76 | Wt 213.8 lb

## 2020-04-23 DIAGNOSIS — D270 Benign neoplasm of right ovary: Secondary | ICD-10-CM | POA: Insufficient documentation

## 2020-04-23 DIAGNOSIS — D271 Benign neoplasm of left ovary: Secondary | ICD-10-CM

## 2020-04-23 NOTE — Patient Instructions (Signed)
https://www.[facility].org/womens-health/faqs/ovarian-cysts">  Ovarian Cystectomy Ovarian cystectomy is a procedure that is done to remove a fluid-filled sac (cyst) on an ovary. The ovaries are small organs that produce eggs in women. Various types of cysts can form on the ovaries. Most cysts are not cancerous. This procedure may be done for cysts that are large, cause symptoms, or do not go away on their own. It may also be done for a cyst that is cancerous or might be cancerous. This surgery can be done using a laparoscopic technique or an open abdominal technique. The laparoscopic technique is minimally invasive and results in smaller incisions and a faster recovery. The technique used will depend on your age, the type of cyst that you have, and whether the cyst is cancerous. The laparoscopic technique is not used for a cancerous cyst. Tell a health care provider about:  Any allergies you have.  All medicines you are taking, including vitamins, herbs, eye drops, creams, and over-the-counter medicines.  Any problems you or family members have had with anesthetic medicines.  Any blood disorders you have.  Any surgeries you have had.  Any medical conditions you have.  Whether you are pregnant or may be pregnant. What are the risks? Generally, this is a safe procedure. However, problems may occur, including:  Excessive bleeding.  Infection.  Damage to nearby structures or organs.  Allergic reactions to medicines.  Blood clots.  Inability to get pregnant (infertility). What happens before the procedure? Staying hydrated Follow instructions from your health care provider about hydration, which may include:  Up to 2 hours before the procedure - you may continue to drink clear liquids, such as water, clear fruit juice, black coffee, and plain tea.   Eating and drinking restrictions Follow instructions from your health care provider about eating and drinking, which may include:  8 hours  before the procedure - stop eating heavy meals or foods, such as meat, fried foods, or fatty foods.  6 hours before the procedure - stop eating light meals or foods, such as toast or cereal.  6 hours before the procedure - stop drinking milk or drinks that contain milk.  2 hours before the procedure - stop drinking clear liquids. Medicines Ask your health care provider about:  Changing or stopping your regular medicines. This is especially important if you are taking diabetes medicines or blood thinners.  Taking medicines such as aspirin and ibuprofen. These medicines can thin your blood. Do not take these medicines unless your health care provider tells you to take them.  Taking over-the-counter medicines, vitamins, herbs, and supplements. General instructions  Do not use any products that contain nicotine or tobacco for at least 4 weeks before the procedure. These products include cigarettes, e-cigarettes, and chewing tobacco. If you need help quitting, ask your health care provider.  Ask your health care provider: ? How your surgery site will be marked. ? What steps will be taken to help prevent infection. These may include:  Removing hair at the surgery site.  Washing skin with a germ-killing soap.  Taking antibiotic medicine.  You may be asked to shower with a germ-killing soap.  Plan to have someone take you home from the hospital or clinic.  Plan to have someone help with household activities for 1-2 weeks after the procedure.  Let your health care provider know if you develop a cold or any infection before your surgery. What happens during the procedure?  An IV will be inserted into one of your veins.  You will be given one or more of the following: ? A medicine to help you relax (sedative). ? A medicine to make you fall asleep (general anesthetic).  Small monitors will be attached to your body. They will be used to check your heart, blood pressure, and oxygen  level.  A breathing tube will be placed to help you breathe during the procedure.  Your surgeon will do the surgery using either the laparoscopic technique or the open abdominal technique. Laparoscopic technique  Several small incisions will be made in your abdomen.  Your abdomen will be filled with carbon dioxide gas to make it expand. This will give the surgeon more room to operate. It will also make your organs easier to see.  A thin scope with a camera (laparoscope) will be put through one of the small incisions. The laparoscope will send a picture to a monitor in the operating room to help the surgeon see inside your body.  Hollow tubes will be put through the other small incisions in your abdomen. The tools needed for the procedure will be put through these tubes.  The ovary with the cyst will be identified, and the cyst will be removed.  The tools will then be removed, and the incisions will be closed with stitches or skin glue. Bandages (dressings) may be applied to the incisions.   Open abdominal technique  A single, large incision will be made along your bikini line or in the middle of your lower abdomen.  The ovary with the cyst will be identified, and the cyst will be removed.  The incision will then be closed with stitches or staples.  Bandages (dressings) may be applied to the incisions. The procedure may vary among health care providers and hospitals. What happens after the procedure?  Your blood pressure, heart rate, breathing rate, and blood oxygen level will be monitored until you leave the hospital or clinic.  Your IV will be removed after you are able to eat and drink well.  You may be given medicine for pain or to help you sleep.  You may be given an antibiotic medicine.  Do not drive for 24 hours if you were given a sedative during the procedure.  The cyst that was removed will be sent to the lab for testing. If the cyst has cancer cells, both ovaries may  need to be removed during a different surgery.  It is up to you to get the results of your procedure. Ask your health care provider, or the department that is doing the procedure, when your results will be ready. Summary  Ovarian cystectomy is a procedure that is done to remove a cyst on an ovary.  This procedure may be done for cysts that are large, cause symptoms, or do not go away on their own. It may also be done for a cyst that is cancerous or might be cancerous.  Follow instructions from your health care provider about eating and drinking before the procedure.  After the cyst is removed, it will be sent to the lab for testing. This information is not intended to replace advice given to you by your health care provider. Make sure you discuss any questions you have with your health care provider. Document Revised: 09/06/2019 Document Reviewed: 09/06/2019 Elsevier Patient Education  2021 [employer].

## 2020-04-23 NOTE — Progress Notes (Signed)
  CC: dermoid cyst Subjective:    Patient ID: Tara Mathews, female    DOB: 09-10-1990, 30 y.o.   MRN: [id number]  HPI Pt seen for follow up of ultrasound findings.  Bilateral dermoid cysts noted.  L>R.  Pt was scheduled for laparotomy and bilateral cystectomy; however, due to Covid scheduling, her case has been moved to March 2022.  Procedure discussed in detail .  Pt will have preop prior to the procedure.   Review of Systems     Objective:   Physical Exam Vitals:   04/23/20 1048  BP: 121/83  Pulse: 76         Assessment & Plan:   1. Dermoid cyst of right ovary   2. Dermoid cyst of ovary, left Exploratory mini laparotomy for bilateral ovarian cystectomy, March 2022  I spent 10 minutes dedicated to the care of this patient including previsit review of records, face to face time with the patient discussing diagnosis with necessary procedures and post visit testing.  Pt will have preoperative visit 1-2 weeks prior to her procedure.   Griffin Basil, MD Faculty Attending, Center for [facility]

## 2020-04-23 NOTE — Progress Notes (Signed)
Patient presents for Pre-Op Visit today.   LMP: 04/17/2020  Pt needs to discuss U/S results from 03/26/20 pt states she never received a call about results.  Pt needs to confirm what procedure will be done and cost.

## 2020-04-25 ENCOUNTER — Other Ambulatory Visit (HOSPITAL_COMMUNITY): Payer: No Typology Code available for payment source

## 2020-05-05 ENCOUNTER — Other Ambulatory Visit: Payer: Self-pay

## 2020-05-05 ENCOUNTER — Other Ambulatory Visit: Payer: No Typology Code available for payment source

## 2020-05-05 DIAGNOSIS — Z20822 Contact with and (suspected) exposure to covid-19: Secondary | ICD-10-CM

## 2020-05-06 LAB — NOVEL CORONAVIRUS, NAA: SARS-CoV-2, NAA: NOT DETECTED

## 2020-05-06 LAB — SARS-COV-2, NAA 2 DAY TAT

## 2020-05-30 ENCOUNTER — Other Ambulatory Visit: Payer: Self-pay

## 2020-05-30 ENCOUNTER — Ambulatory Visit: Payer: Self-pay | Attending: Nurse Practitioner

## 2020-06-04 ENCOUNTER — Encounter: Payer: Self-pay | Admitting: Nurse Practitioner

## 2020-06-04 ENCOUNTER — Other Ambulatory Visit: Payer: Self-pay | Admitting: Obstetrics and Gynecology

## 2020-06-04 ENCOUNTER — Other Ambulatory Visit: Payer: Self-pay

## 2020-06-04 ENCOUNTER — Ambulatory Visit: Payer: Self-pay | Attending: Nurse Practitioner | Admitting: Nurse Practitioner

## 2020-06-04 ENCOUNTER — Other Ambulatory Visit: Payer: Self-pay | Admitting: Nurse Practitioner

## 2020-06-04 VITALS — BP 125/77 | HR 89 | Temp 97.8°F | Ht 60.24 in | Wt 222.0 lb

## 2020-06-04 DIAGNOSIS — K760 Fatty (change of) liver, not elsewhere classified: Secondary | ICD-10-CM

## 2020-06-04 DIAGNOSIS — Z130 Encounter for screening for diseases of the blood and blood-forming organs and certain disorders involving the immune mechanism: Secondary | ICD-10-CM

## 2020-06-04 DIAGNOSIS — Z1159 Encounter for screening for other viral diseases: Secondary | ICD-10-CM

## 2020-06-04 DIAGNOSIS — Z833 Family history of diabetes mellitus: Secondary | ICD-10-CM

## 2020-06-04 DIAGNOSIS — E669 Obesity, unspecified: Secondary | ICD-10-CM

## 2020-06-04 DIAGNOSIS — K2100 Gastro-esophageal reflux disease with esophagitis, without bleeding: Secondary | ICD-10-CM

## 2020-06-04 MED ORDER — OMEPRAZOLE 40 MG PO CPDR: 40.0000 mg | DELAYED_RELEASE_CAPSULE | Freq: Every day | ORAL | 1 refills | Status: DC

## 2020-06-04 MED FILL — OMEPRAZOLE DR 40 MG CAPSULE: 40 | 90 days supply | Qty: 90 | Fill #0

## 2020-06-04 NOTE — Progress Notes (Signed)
Assessment & Plan:  Tara Mathews was seen today for follow-up.  Diagnoses and all orders for this visit:  Gastroesophageal reflux disease with esophagitis without hemorrhage -     omeprazole (PRILOSEC) 40 MG capsule; Take 1 capsule (40 mg total) by mouth daily.  Need for hepatitis C screening test -     HCV Ab w Reflex to Quant PCR  Fatty liver -     CMP14+EGFR  Family history of diabetes mellitus in grandmother -     Hemoglobin A1c  Screening for deficiency anemia -     CBC  Obesity (BMI 30-39.9) -     Lipid panel; Future Discussed diet and exercise for person with BMI >40. Instructed: You must burn more calories than you eat. Losing 5 percent of your body weight should be considered a success. In the longer term, losing more than 15 percent of your body weight and staying at this weight is an extremely good result. However, keep in mind that even losing 5 percent of your body weight leads to important health benefits, so try not to get discouraged if you're not able to lose more than this. Will recheck weight in 3-6 months.   Patient has been counseled on age-appropriate routine health concerns for screening and prevention. These are reviewed and up-to-date. Referrals have been placed accordingly. Immunizations are up-to-date or declined.    Subjective:   Chief Complaint  Patient presents with  . Follow-up    Patient is here for a follow up and would like medication refill.    HPI Tara Mathews 30 y.o. female presents to office today for follow up and requesting refill of omeprazole.  Blood pressure is well controlled. She does have a history of FLD. We discussed probable causes today including diet and weight.  BP Readings from Last 3 Encounters:  06/04/20 125/77  04/23/20 121/83  04/03/20 120/88   Wt Readings from Last 3 Encounters:  06/04/20 222 lb (100.7 kg)  04/23/20 213 lb 12.8 oz (97 kg)  04/03/20 214 lb 8 oz (97.3 kg)    GERD Symptoms of GERD are  controlled with omeprazole 40 mg daily.    GYN She has a dermoid cyst of the left ovary. Laparotomy is scheduled in the upcoming few weeks. She is waiting to hear back from GYN regarding her preop office visit. I will reach out to Dr. Elgie Congo' office and request that they reach out to her to schedule this.She was started on omeprazole during her pregnancy a few years ago. She has taken pantoprazole in the past but states it became ineffective after taking it for several months. Symptoms include burning in the epigastric area as well as right upper quadrant.     Review of Systems  Constitutional: Negative for fever, malaise/fatigue and weight loss.  HENT: Negative.  Negative for nosebleeds.   Eyes: Negative.  Negative for blurred vision, double vision and photophobia.  Respiratory: Negative.  Negative for cough and shortness of breath.   Cardiovascular: Negative.  Negative for chest pain, palpitations and leg swelling.  Gastrointestinal: Positive for heartburn. Negative for abdominal pain, blood in stool, constipation, diarrhea, melena, nausea and vomiting.  Musculoskeletal: Negative.  Negative for myalgias.  Neurological: Negative.  Negative for dizziness, focal weakness, seizures and headaches.  Psychiatric/Behavioral: Negative.  Negative for suicidal ideas.    Past Medical History:  Diagnosis Date  . Anxiety     Past Surgical History:  Procedure Laterality Date  . NO PAST SURGERIES  Family History  Problem Relation Age of Onset  . Miscarriages / Stillbirths Maternal Aunt   . Cancer Maternal Uncle        Brain tumor  . Diabetes Paternal Grandmother   . Obesity Mother   . Arthritis Father   . Hypertension Father   . Obesity Father     Social History Reviewed with no changes to be made today.   Outpatient Medications Prior to Visit  Medication Sig Dispense Refill  . omeprazole (PRILOSEC) 40 MG capsule Take 1 capsule (40 mg total) by mouth daily. 90 capsule 0   No  facility-administered medications prior to visit.    Allergies  Allergen Reactions  . Bactrim [Sulfamethoxazole-Trimethoprim] Hives       Objective:    BP 125/77 (BP Location: Left Arm, Patient Position: Sitting, Cuff Size: Large)   Pulse 89   Temp 97.8 F (36.6 C) (Oral)   Ht 5' 0.24" (1.53 m)   Wt 222 lb (100.7 kg)   LMP 05/21/2020   SpO2 97%   BMI 43.02 kg/m  Wt Readings from Last 3 Encounters:  06/04/20 222 lb (100.7 kg)  04/23/20 213 lb 12.8 oz (97 kg)  04/03/20 214 lb 8 oz (97.3 kg)    Physical Exam Vitals and nursing note reviewed.  Constitutional:      Appearance: She is well-developed and well-nourished.  HENT:     Head: Normocephalic and atraumatic.  Eyes:     Extraocular Movements: EOM normal.  Cardiovascular:     Rate and Rhythm: Normal rate and regular rhythm.     Pulses: Intact distal pulses.     Heart sounds: Normal heart sounds. No murmur heard. No friction rub. No gallop.   Pulmonary:     Effort: Pulmonary effort is normal. No tachypnea or respiratory distress.     Breath sounds: Normal breath sounds. No decreased breath sounds, wheezing, rhonchi or rales.  Chest:     Chest wall: No tenderness.  Abdominal:     General: Bowel sounds are normal.     Palpations: Abdomen is soft.  Musculoskeletal:        General: No edema. Normal range of motion.     Cervical back: Normal range of motion.  Skin:    General: Skin is warm and dry.  Neurological:     Mental Status: She is alert and oriented to person, place, and time.     Coordination: Coordination normal.  Psychiatric:        Mood and Affect: Mood and affect normal.        Behavior: Behavior normal. Behavior is cooperative.        Thought Content: Thought content normal.        Judgment: Judgment normal.          Patient has been counseled extensively about nutrition and exercise as well as the importance of adherence with medications and regular follow-up. The patient was given clear  instructions to go to ER or return to medical center if symptoms don't improve, worsen or new problems develop. The patient verbalized understanding.   Follow-up: Return if symptoms worsen or fail to improve.   Gildardo Pounds, FNP-BC [facility] and [facility], [facility], [place]   06/07/2020, 10:06 AM

## 2020-06-04 NOTE — Progress Notes (Signed)
preop orders placed

## 2020-06-05 ENCOUNTER — Ambulatory Visit: Payer: Self-pay | Attending: Nurse Practitioner

## 2020-06-05 ENCOUNTER — Other Ambulatory Visit: Payer: Self-pay

## 2020-06-05 ENCOUNTER — Other Ambulatory Visit: Payer: Self-pay | Admitting: Nurse Practitioner

## 2020-06-05 DIAGNOSIS — E669 Obesity, unspecified: Secondary | ICD-10-CM

## 2020-06-05 LAB — HEMOGLOBIN A1C
Est. average glucose Bld gHb Est-mCnc: 105 mg/dL
Hgb A1c MFr Bld: 5.3 % (ref 4.8–5.6)

## 2020-06-05 LAB — CBC
Hematocrit: 40.3 % (ref 34.0–46.6)
Hemoglobin: 13.3 g/dL (ref 11.1–15.9)
MCH: 28.1 pg (ref 26.6–33.0)
MCHC: 33 g/dL (ref 31.5–35.7)
MCV: 85 fL (ref 79–97)
Platelets: 378 10*3/uL (ref 150–450)
RBC: 4.74 x10E6/uL (ref 3.77–5.28)
RDW: 12.5 % (ref 11.7–15.4)
WBC: 10.4 10*3/uL (ref 3.4–10.8)

## 2020-06-05 LAB — HCV INTERPRETATION

## 2020-06-05 LAB — CMP14+EGFR
ALT: 81 IU/L — ABNORMAL HIGH (ref 0–32)
AST: 39 IU/L (ref 0–40)
Albumin/Globulin Ratio: 1.6 (ref 1.2–2.2)
Albumin: 4.5 g/dL (ref 3.9–5.0)
Alkaline Phosphatase: 105 IU/L (ref 44–121)
BUN/Creatinine Ratio: 25 — ABNORMAL HIGH (ref 9–23)
BUN: 12 mg/dL (ref 6–20)
Bilirubin Total: <0.2 mg/dL (ref 0.0–1.2)
CO2: 23 mmol/L (ref 20–29)
Calcium: 9.7 mg/dL (ref 8.7–10.2)
Chloride: 101 mmol/L (ref 96–106)
Creatinine, Ser: 0.48 mg/dL — ABNORMAL LOW (ref 0.57–1.00)
GFR calc Af Amer: 154 mL/min/{1.73_m2} (ref >59)
GFR calc non Af Amer: 134 mL/min/{1.73_m2} (ref >59)
Globulin, Total: 2.8 g/dL (ref 1.5–4.5)
Glucose: 98 mg/dL (ref 65–99)
Potassium: 4.3 mmol/L (ref 3.5–5.2)
Sodium: 138 mmol/L (ref 134–144)
Total Protein: 7.3 g/dL (ref 6.0–8.5)

## 2020-06-05 LAB — HCV AB W REFLEX TO QUANT PCR: HCV Ab: <0.1 s/co ratio (ref 0.0–0.9)

## 2020-06-06 LAB — LIPID PANEL
Chol/HDL Ratio: 5.7 ratio — ABNORMAL HIGH (ref 0.0–4.4)
Cholesterol, Total: 233 mg/dL — ABNORMAL HIGH (ref 100–199)
HDL: 41 mg/dL (ref >39)
LDL Chol Calc (NIH): 146 mg/dL — ABNORMAL HIGH (ref 0–99)
Triglycerides: 252 mg/dL — ABNORMAL HIGH (ref 0–149)
VLDL Cholesterol Cal: 46 mg/dL — ABNORMAL HIGH (ref 5–40)

## 2020-06-07 ENCOUNTER — Encounter: Payer: Self-pay | Admitting: Nurse Practitioner

## 2020-06-09 ENCOUNTER — Telehealth: Payer: Self-pay | Admitting: Nurse Practitioner

## 2020-06-09 NOTE — Telephone Encounter (Signed)
Copied from [place] [id number]. Topic: General - Other >> Jun 09, 2020  4:15 PM Tessa Lerner A wrote: Reason for CRM: Patient has made contact regarding financial counseling and requesting to be contacted about reapplying for financial aid Patient has surgery scheduled for 06/17/20 and would like to be contacted prior to that Please contact to advise   I see where patient has active OC until 11/2020. Please follow up with patient.

## 2020-06-10 ENCOUNTER — Encounter: Payer: Self-pay | Admitting: Obstetrics and Gynecology

## 2020-06-10 ENCOUNTER — Other Ambulatory Visit: Payer: Self-pay

## 2020-06-10 ENCOUNTER — Ambulatory Visit (INDEPENDENT_AMBULATORY_CARE_PROVIDER_SITE_OTHER): Payer: Self-pay | Admitting: Obstetrics and Gynecology

## 2020-06-10 VITALS — BP 119/79 | HR 87 | Ht 60.0 in | Wt 217.0 lb

## 2020-06-10 DIAGNOSIS — M549 Dorsalgia, unspecified: Secondary | ICD-10-CM

## 2020-06-10 NOTE — Telephone Encounter (Signed)
I return Pt call, LVM, Pt was inform that still waiting from [facility] to review her application no other information at this time

## 2020-06-10 NOTE — Progress Notes (Signed)
GYN presents for PreOp visit for Laparotomy and Cystectomy.  C/o pelvic and back pain 7-8/10 x 3 days, denies dysuria.

## 2020-06-11 LAB — POCT URINALYSIS DIPSTICK
Bilirubin, UA: NEGATIVE
Glucose, UA: NEGATIVE
Ketones, UA: NEGATIVE
Nitrite, UA: NEGATIVE
Protein, UA: POSITIVE — AB
Spec Grav, UA: >=1.03 — AB (ref 1.010–1.025)
Urobilinogen, UA: 0.2 E.U./dL
pH, UA: 5 (ref 5.0–8.0)

## 2020-06-11 NOTE — Progress Notes (Signed)
OB/GYN Pre-Op History and Physical  Tara Mathews is a 30 y.o. G1P1001 presenting for preoperative visit for laparotomy for bilateral dermoid cysts.  Discussed laparotomy approach reasons including possible decreased surgical time and greater chance to retain both ovaries.  Risks and benefits of the procedure have been given including bleeding, infection, involvement of other organs including bladder and bowel.  Pt is aware that she will be inpatient for 1-2 days postop.       Past Medical History:  Diagnosis Date  . Anxiety     Past Surgical History:  Procedure Laterality Date  . NO PAST SURGERIES      OB History  Gravida Para Term Preterm AB Living  1 1 1     1   SAB IAB Ectopic Multiple Live Births        0 1    # Outcome Date GA Lbr Len/2nd Weight Sex Delivery Anes PTL Lv  1 Term 05/08/19 [redacted]w[redacted]d 38:52 / 01:33 7 lb 2.1 oz (3.235 kg) M Vag-Spont EPI  LIV     Birth Comments: moulding/caput    Social History   Socioeconomic History  . Marital status: Single    Spouse name: Not on file  . Number of children: 1  . Years of education: Not on file  . Highest education level: Associate degree: occupational, Hotel manager, or vocational program  Occupational History  . Occupation: UNEMPLOYED  Tobacco Use  . Smoking status: Never Smoker  . Smokeless tobacco: Never Used  Vaping Use  . Vaping Use: Never used  Substance and Sexual Activity  . Alcohol use: No  . Drug use: No  . Sexual activity: Yes    Birth control/protection: None    Comment: 1st intercourse 30 yo-Fewer than 5 partners  Other Topics Concern  . Not on file  Social History Narrative  . Not on file   Social Determinants of Health   Financial Resource Strain: Not on file  Food Insecurity: Not on file  Transportation Needs: No Transportation Needs  . Lack of Transportation (Medical): No  . Lack of Transportation (Non-Medical): No  Physical Activity: Not on file  Stress: Not on file  Social  Connections: Not on file    Family History  Problem Relation Age of Onset  . Miscarriages / Stillbirths Maternal Aunt   . Cancer Maternal Uncle        Brain tumor  . Diabetes Paternal Grandmother   . Obesity Mother   . Arthritis Father   . Hypertension Father   . Obesity Father     (Not in a hospital admission)   Allergies  Allergen Reactions  . Bactrim [Sulfamethoxazole-Trimethoprim] Hives    Review of Systems: Negative except for what is mentioned in HPI.     Physical Exam: BP 119/79   Pulse 87   Ht 5' (1.524 m)   Wt 217 lb (98.4 kg)   LMP 05/21/2020   BMI 42.38 kg/m  CONSTITUTIONAL: Well-developed, well-nourished female in no acute distress.  HENT:  Normocephalic, atraumatic, External right and left ear normal. Oropharynx is clear and moist EYES: Conjunctivae and EOM are normal.   NECK: Normal range of motion, supple, no masses SKIN: Skin is warm and dry. No rash noted. Not diaphoretic. No erythema. No pallor. [place]: Alert and oriented to person, place, and time. Normal reflexes, muscle tone coordination. No cranial nerve deficit noted. PSYCHIATRIC: Normal mood and affect. Normal behavior. Normal judgment and thought content. CARDIOVASCULAR: Normal heart rate noted, regular rhythm RESPIRATORY: Effort  and breath sounds normal, no problems with respiration noted ABDOMEN: Soft, nontender, nondistended, obese. PELVIC: uterus is mobile, left adnexal mass detected with adnexal fullness MUSCULOSKELETAL: Normal range of motion. No edema and no tenderness. 2+ distal pulses.   Pertinent Labs/Studies:   Results for orders placed or performed in visit on 06/10/20 (from the past 72 hour(s))  POCT Urinalysis Dipstick     Status: Abnormal   Collection Time: 06/11/20 11:55 AM  Result Value Ref Range   Color, UA YELLOW    Clarity, UA CLEAR    Glucose, UA Negative Negative   Bilirubin, UA NEGATIVE    Ketones, UA NEGATIVE    Spec Grav, UA >=1.030 (A) 1.010 - 1.025    Blood, UA +SMALL    pH, UA 5.0 5.0 - 8.0   Protein, UA Positive (A) Negative   Urobilinogen, UA 0.2 0.2 or 1.0 E.U./dL   Nitrite, UA NEGATIVE    Leukocytes, UA Trace (A) Negative   Appearance     Odor         Assessment and Plan :Tara Mathews is a 30 y.o. G1P1001 here for planned bilateral cystectomy through laparotomy approach.  Pt will be NPO the night before the procedure.  Preoperative orders have been entered.  Again pt is aware of risk and benefits of the procedure including bleeding, infection, involvement of other organs including bladder and bowel.  Tara Mathews, M.D. Attending [place], [facility] for [employer], [place]

## 2020-06-11 NOTE — H&P (View-Only) (Signed)
OB/GYN Pre-Op History and Physical  Tara Mathews is a 30 y.o. G1P1001 presenting for preoperative visit for laparotomy for bilateral dermoid cysts.  Discussed laparotomy approach reasons including possible decreased surgical time and greater chance to retain both ovaries.  Risks and benefits of the procedure have been given including bleeding, infection, involvement of other organs including bladder and bowel.  Pt is aware that she will be inpatient for 1-2 days postop.       Past Medical History:  Diagnosis Date  . Anxiety     Past Surgical History:  Procedure Laterality Date  . NO PAST SURGERIES      OB History  Gravida Para Term Preterm AB Living  1 1 1     1   SAB IAB Ectopic Multiple Live Births        0 1    # Outcome Date GA Lbr Len/2nd Weight Sex Delivery Anes PTL Lv  1 Term 05/08/19 [redacted]w[redacted]d 38:52 / 01:33 7 lb 2.1 oz (3.235 kg) M Vag-Spont EPI  LIV     Birth Comments: moulding/caput    Social History   Socioeconomic History  . Marital status: Single    Spouse name: Not on file  . Number of children: 1  . Years of education: Not on file  . Highest education level: Associate degree: occupational, Hotel manager, or vocational program  Occupational History  . Occupation: UNEMPLOYED  Tobacco Use  . Smoking status: Never Smoker  . Smokeless tobacco: Never Used  Vaping Use  . Vaping Use: Never used  Substance and Sexual Activity  . Alcohol use: No  . Drug use: No  . Sexual activity: Yes    Birth control/protection: None    Comment: 1st intercourse 30 yo-Fewer than 5 partners  Other Topics Concern  . Not on file  Social History Narrative  . Not on file   Social Determinants of Health   Financial Resource Strain: Not on file  Food Insecurity: Not on file  Transportation Needs: No Transportation Needs  . Lack of Transportation (Medical): No  . Lack of Transportation (Non-Medical): No  Physical Activity: Not on file  Stress: Not on file  Social  Connections: Not on file    Family History  Problem Relation Age of Onset  . Miscarriages / Stillbirths Maternal Aunt   . Cancer Maternal Uncle        Brain tumor  . Diabetes Paternal Grandmother   . Obesity Mother   . Arthritis Father   . Hypertension Father   . Obesity Father     (Not in a hospital admission)   Allergies  Allergen Reactions  . Bactrim [Sulfamethoxazole-Trimethoprim] Hives    Review of Systems: Negative except for what is mentioned in HPI.     Physical Exam: BP 119/79   Pulse 87   Ht 5' (1.524 m)   Wt 217 lb (98.4 kg)   LMP 05/21/2020   BMI 42.38 kg/m  CONSTITUTIONAL: Well-developed, well-nourished female in no acute distress.  HENT:  Normocephalic, atraumatic, External right and left ear normal. Oropharynx is clear and moist EYES: Conjunctivae and EOM are normal.   NECK: Normal range of motion, supple, no masses SKIN: Skin is warm and dry. No rash noted. Not diaphoretic. No erythema. No pallor. [name]: Alert and oriented to person, place, and time. Normal reflexes, muscle tone coordination. No cranial nerve deficit noted. PSYCHIATRIC: Normal mood and affect. Normal behavior. Normal judgment and thought content. CARDIOVASCULAR: Normal heart rate noted, regular rhythm RESPIRATORY: Effort  and breath sounds normal, no problems with respiration noted ABDOMEN: Soft, nontender, nondistended, obese. PELVIC: uterus is mobile, left adnexal mass detected with adnexal fullness MUSCULOSKELETAL: Normal range of motion. No edema and no tenderness. 2+ distal pulses.   Pertinent Labs/Studies:   Results for orders placed or performed in visit on 06/10/20 (from the past 72 hour(s))  POCT Urinalysis Dipstick     Status: Abnormal   Collection Time: 06/11/20 11:55 AM  Result Value Ref Range   Color, UA YELLOW    Clarity, UA CLEAR    Glucose, UA Negative Negative   Bilirubin, UA NEGATIVE    Ketones, UA NEGATIVE    Spec Grav, UA >=1.030 (A) 1.010 - 1.025    Blood, UA +SMALL    pH, UA 5.0 5.0 - 8.0   Protein, UA Positive (A) Negative   Urobilinogen, UA 0.2 0.2 or 1.0 E.U./dL   Nitrite, UA NEGATIVE    Leukocytes, UA Trace (A) Negative   Appearance     Odor         Assessment and Plan :Tara Mathews is a 30 y.o. G1P1001 here for planned bilateral cystectomy through laparotomy approach.  Pt will be NPO the night before the procedure.  Preoperative orders have been entered.  Again pt is aware of risk and benefits of the procedure including bleeding, infection, involvement of other organs including bladder and bowel.  [name], M.D. Attending [place], [facility] for [employer], [place]

## 2020-06-11 NOTE — Patient Instructions (Signed)
Exploratory Laparotomy, Adult Exploratory laparotomy is a surgical procedure to examine the organs inside the body by making an incision in the abdomen. You may have this procedure if you have pain in your abdomen, a serious injury (trauma), bleeding, infection, or blockage (obstruction). Exploratory laparotomy may be a planned procedure or an emergency procedure. You may have surgical treatment as part of the laparotomy, or you may have additional treatment after your laparotomy. Tell a health care provider about:  Any allergies you have.  All medicines you are taking, including vitamins, herbs, eye drops, creams, and over-the-counter medicines.  Any problems you or family members have had with anesthetic medicines.  Any blood disorders you have.  Any surgeries you have had.  Any medical conditions you have.  Whether you are pregnant or may be pregnant. What are the risks? Generally, this is a safe procedure. However, problems may occur, including:  Bleeding.  Infection.  A blood clot that forms in your leg and travels to your lungs.  Damage to organs inside your abdomen.  Scar tissue that blocks your digestive tract. What happens before the procedure? Staying hydrated Follow instructions from your health care provider about hydration, which may include:  Up to 2 hours before the procedure - you may continue to drink clear liquids, such as water, clear fruit juice, black coffee, and plain tea. Eating and drinking restrictions Follow instructions from your health care provider about eating and drinking, which may include:  8 hours before the procedure - stop eating heavy meals or foods, such as meat, fried foods, or fatty foods.  6 hours before the procedure - stop eating light meals or foods, such as toast or cereal.  6 hours before the procedure - stop drinking milk or drinks that contain milk.  2 hours before the procedure - stop drinking clear liquids. Medicines Ask  your health care provider about:  Changing or stopping your regular medicines. This is especially important if you are taking diabetes medicines or blood thinners.  Taking medicines such as aspirin and ibuprofen. These medicines can thin your blood. Do not take these medicines unless your health care provider tells you to take them.  Taking over-the-counter medicines, vitamins, herbs, and supplements. Tests  You may have imaging studies, such as: ? Ultrasound. ? CT scan. ? MRI.  You may have blood or urine tests. General instructions  You may be given instructions for clearing out your bowel before surgery (bowel prep). If you are already in the hospital, the bowel prep may be done there.  Do not use any products that contain nicotine or tobacco for at least 4 weeks before the procedure. These products include cigarettes, chewing tobacco, and vaping devices, such as e-cigarettes. If you need help quitting, ask your health care provider.  Ask your health care provider what steps will be taken to help prevent infection. These steps may include: ? Removing hair at the surgery site. ? Washing skin with a germ-killing soap. ? Taking antibiotic medicine. What happens during the procedure?  An IV will be inserted into one of your veins.  You will be given one or more of the following: ? A medicine to help you relax (sedative). ? A medicine to make you fall asleep (general anesthetic).  A tube may be placed through your nose or mouth and into your stomach (nasogastric tube or NG tube) to drain your stomach fluids.  A tube will be placed into your windpipe (breathing tube) to help you breathe during  the procedure.  You may have a tube placed into your bladder (urinary catheter) to drain urine.  Tight-fitting (compression) stockings may be placed on your legs to promote circulation.  Your health care provider will make an incision in your abdomen. This is usually a vertical incision in  the middle of your abdomen.  All organs of the abdomen will be checked.  The rest of the procedure will depend on what your health care provider finds. ? If there is damage or obstruction in any of the organs, the health care provider will repair it. ? If there is blood in the abdomen, the health care provider will look for the source of the bleeding to stop it. ? If there is pus or stomach (gastric) fluids in your abdomen, the health care provider will check for an infection or a hole (perforation) in your digestive tract. ? If the health care provider finds an infection, a drain may be placed to empty fluid that can build up in your abdomen after surgery. ? If there is a growth (tumor) inside your abdomen, the health care provider may remove a piece of the growth (biopsy) to examine it under a microscope.  When the procedure is complete, the incision will be closed with stitches (sutures), skin glue, or adhesive strips.  A bandage (dressing) may be placed over the incision. The procedure may vary among health care providers and hospitals.   What happens after the procedure?  Your blood pressure, heart rate, breathing rate, and blood oxygen level will be monitored until you leave the hospital or clinic.  You will continue to receive fluids through your IV. When you are eating and drinking well, the IV will be removed.  You may also get antibiotic medicine and pain medicine through your IV.  Your nasogastric tube may be removed when you start to pass gas.  Your urinary catheter may be removed as soon as you are making enough urine.  You may have to wear or continue to wear compression stockings to help prevent blood clots and reduce swelling in your legs.  You will be encouraged to walk as soon as possible. You will also do breathing exercises to keep your lungs clear.   Summary  Exploratory laparotomy is a surgical procedure to examine the organs inside your abdomen.  You may have  this procedure if you have pain in your abdomen, trauma, bleeding, infection, or obstruction.  Exploratory laparotomy may be a planned procedure or an emergency procedure. This information is not intended to replace advice given to you by your health care provider. Make sure you discuss any questions you have with your health care provider. Document Revised: 01/23/2020 Document Reviewed: 12/11/2019 Elsevier Patient Education  2021 [employer].

## 2020-06-16 ENCOUNTER — Encounter (HOSPITAL_COMMUNITY): Payer: Self-pay | Admitting: Obstetrics and Gynecology

## 2020-06-16 NOTE — Progress Notes (Signed)
EKG: na CXR: na ECHO: denies Stress Test: denies Cardiac Cath: denies  Fasting Blood Sugar- na Checks Blood Sugar_na__ times a day  OSA/CPAP:  No  ASA/Blood Thinners:  No  Covid test positive end of January 2022.  Will bring results DOS.  Anesthesia Review:  No  Patient denies shortness of breath, fever, cough, and chest pain at PAT appointment.  Patient verbalized understanding of instructions provided today at the PAT appointment.  Patient asked to review instructions at home and day of surgery.

## 2020-06-16 NOTE — Anesthesia Preprocedure Evaluation (Addendum)
Anesthesia Evaluation  Patient identified by MRN, date of birth, ID band Patient awake    Reviewed: Allergy & Precautions, NPO status , Patient's Chart, lab work & pertinent test results  History of Anesthesia Complications Negative for: history of anesthetic complications  Airway Mallampati: III  TM Distance: >3 FB Neck ROM: Full    Dental no notable dental hx. (+) Dental Advisory Given   Pulmonary neg pulmonary ROS,    Pulmonary exam normal        Cardiovascular Normal cardiovascular exam     Neuro/Psych negative neurological ROS     GI/Hepatic negative GI ROS, Neg liver ROS,   Endo/Other  Morbid obesity  Renal/GU negative Renal ROS     Musculoskeletal negative musculoskeletal ROS (+)   Abdominal   Peds  Hematology negative hematology ROS (+)   Anesthesia Other Findings   Reproductive/Obstetrics                            Anesthesia Physical Anesthesia Plan  ASA: III  Anesthesia Plan: General   Post-op Pain Management:    Induction: Intravenous  PONV Risk Score and Plan: 4 or greater and Ondansetron, Dexamethasone, Midazolam and Scopolamine patch - Pre-op  Airway Management Planned: Oral ETT  Additional Equipment:   Intra-op Plan:   Post-operative Plan: Extubation in OR  Informed Consent: I have reviewed the patients History and Physical, chart, labs and discussed the procedure including the risks, benefits and alternatives for the proposed anesthesia with the patient or authorized representative who has indicated his/her understanding and acceptance.     Dental advisory given  Plan Discussed with: Anesthesiologist and CRNA  Anesthesia Plan Comments:        Anesthesia Quick Evaluation

## 2020-06-17 ENCOUNTER — Inpatient Hospital Stay (HOSPITAL_COMMUNITY)
Admission: RE | Admit: 2020-06-17 | Discharge: 2020-06-19 | DRG: 742 | Disposition: A | Discharge status: Home / Self Care | Payer: No Typology Code available for payment source | Attending: Obstetrics and Gynecology | Admitting: Obstetrics and Gynecology

## 2020-06-17 ENCOUNTER — Observation Stay (HOSPITAL_COMMUNITY): Payer: No Typology Code available for payment source | Admitting: Certified Registered Nurse Anesthetist

## 2020-06-17 ENCOUNTER — Encounter (HOSPITAL_COMMUNITY)
Admission: RE | Disposition: A | Discharge status: Home / Self Care | Payer: Self-pay | Source: Home / Self Care | Attending: Obstetrics and Gynecology

## 2020-06-17 ENCOUNTER — Other Ambulatory Visit: Payer: Self-pay

## 2020-06-17 ENCOUNTER — Encounter (HOSPITAL_COMMUNITY): Payer: Self-pay | Admitting: Obstetrics and Gynecology

## 2020-06-17 DIAGNOSIS — Z6841 Body Mass Index (BMI) 40.0 and over, adult: Secondary | ICD-10-CM

## 2020-06-17 DIAGNOSIS — D271 Benign neoplasm of left ovary: Principal | ICD-10-CM | POA: Diagnosis present

## 2020-06-17 DIAGNOSIS — D279 Benign neoplasm of unspecified ovary: Secondary | ICD-10-CM | POA: Diagnosis present

## 2020-06-17 HISTORY — PX: LAPAROTOMY: SHX154

## 2020-06-17 HISTORY — PX: OVARIAN CYST REMOVAL: SHX89

## 2020-06-17 LAB — CBC
HCT: 40.4 % (ref 36.0–46.0)
HCT: 38.7 % (ref 36.0–46.0)
Hemoglobin: 12.8 g/dL (ref 12.0–15.0)
Hemoglobin: 12.6 g/dL (ref 12.0–15.0)
MCH: 27.9 pg (ref 26.0–34.0)
MCH: 28.3 pg (ref 26.0–34.0)
MCHC: 31.7 g/dL (ref 30.0–36.0)
MCHC: 32.6 g/dL (ref 30.0–36.0)
MCV: 88 fL (ref 80.0–100.0)
MCV: 87 fL (ref 80.0–100.0)
Platelets: 318 10*3/uL (ref 150–400)
Platelets: 307 10*3/uL (ref 150–400)
RBC: 4.59 MIL/uL (ref 3.87–5.11)
RBC: 4.45 MIL/uL (ref 3.87–5.11)
RDW: 12.6 % (ref 11.5–15.5)
RDW: 12.6 % (ref 11.5–15.5)
WBC: 8.3 10*3/uL (ref 4.0–10.5)
WBC: 14.3 10*3/uL — ABNORMAL HIGH (ref 4.0–10.5)
nRBC: 0 % (ref 0.0–0.2)
nRBC: 0 % (ref 0.0–0.2)

## 2020-06-17 LAB — TYPE AND SCREEN
ABO/RH(D): O POS
Antibody Screen: NEGATIVE

## 2020-06-17 LAB — CREATININE, SERUM
Creatinine, Ser: 0.62 mg/dL (ref 0.44–1.00)
GFR, Estimated: >60 mL/min (ref >60)

## 2020-06-17 LAB — POCT PREGNANCY, URINE: Preg Test, Ur: NEGATIVE

## 2020-06-17 SURGERY — LAPAROTOMY
Anesthesia: General | Site: Abdomen

## 2020-06-17 MED ORDER — FENTANYL CITRATE (PF) 250 MCG/5ML IJ SOLN
INTRAMUSCULAR | Status: AC
  Filled 2020-06-17: qty 5

## 2020-06-17 MED ORDER — ORAL CARE MOUTH RINSE: 15.0000 mL | Freq: Once | OROMUCOSAL | Status: AC

## 2020-06-17 MED ORDER — SCOPOLAMINE 1 MG/3DAYS TD PT72
1.0000 | MEDICATED_PATCH | TRANSDERMAL | Status: DC
  Administered 2020-06-17: 1.5 mg via TRANSDERMAL
  Filled 2020-06-17: qty 1

## 2020-06-17 MED ORDER — FENTANYL CITRATE (PF) 100 MCG/2ML IJ SOLN
25.0000 ug | INTRAMUSCULAR | Status: DC | PRN
  Administered 2020-06-17 (×3): 50 ug via INTRAVENOUS

## 2020-06-17 MED ORDER — ENOXAPARIN SODIUM 60 MG/0.6ML ~~LOC~~ SOLN
50.0000 mg | SUBCUTANEOUS | Status: DC
  Administered 2020-06-18: 50 mg via SUBCUTANEOUS
  Filled 2020-06-17: qty 0.6

## 2020-06-17 MED ORDER — POLYETHYLENE GLYCOL 3350 17 G PO PACK: 17.0000 g | PACK | Freq: Every day | ORAL | Status: DC | PRN

## 2020-06-17 MED ORDER — LACTATED RINGERS IV SOLN: INTRAVENOUS | Status: DC

## 2020-06-17 MED ORDER — PHENYLEPHRINE 40 MCG/ML (10ML) SYRINGE FOR IV PUSH (FOR BLOOD PRESSURE SUPPORT)
PREFILLED_SYRINGE | INTRAVENOUS | Status: AC
  Filled 2020-06-17: qty 10

## 2020-06-17 MED ORDER — PROPOFOL 10 MG/ML IV BOLUS
INTRAVENOUS | Status: DC | PRN
  Administered 2020-06-17: 140 mg via INTRAVENOUS

## 2020-06-17 MED ORDER — FENTANYL CITRATE (PF) 100 MCG/2ML IJ SOLN
INTRAMUSCULAR | Status: AC
  Filled 2020-06-17: qty 4

## 2020-06-17 MED ORDER — 0.9 % SODIUM CHLORIDE (POUR BTL) OPTIME
TOPICAL | Status: DC | PRN
  Administered 2020-06-17: 1000 mL

## 2020-06-17 MED ORDER — SODIUM CHLORIDE 0.9% FLUSH: 9.0000 mL | INTRAVENOUS | Status: DC | PRN

## 2020-06-17 MED ORDER — LIDOCAINE 2% (20 MG/ML) 5 ML SYRINGE
INTRAMUSCULAR | Status: DC | PRN
  Administered 2020-06-17: 100 mg via INTRAVENOUS

## 2020-06-17 MED ORDER — CEFAZOLIN SODIUM-DEXTROSE 2-4 GM/100ML-% IV SOLN
2.0000 g | INTRAVENOUS | Status: AC
  Administered 2020-06-17: 2 g via INTRAVENOUS
  Filled 2020-06-17: qty 100

## 2020-06-17 MED ORDER — PROPOFOL 10 MG/ML IV BOLUS
INTRAVENOUS | Status: AC
  Filled 2020-06-17: qty 20

## 2020-06-17 MED ORDER — DOCUSATE SODIUM 100 MG PO CAPS
100.0000 mg | ORAL_CAPSULE | Freq: Two times a day (BID) | ORAL | Status: DC
  Administered 2020-06-17 – 2020-06-19 (×4): 100 mg via ORAL
  Filled 2020-06-17 (×4): qty 1

## 2020-06-17 MED ORDER — MIDAZOLAM HCL 2 MG/2ML IJ SOLN
INTRAMUSCULAR | Status: AC
  Filled 2020-06-17: qty 2

## 2020-06-17 MED ORDER — MORPHINE SULFATE 1 MG/ML IV SOLN PCA
INTRAVENOUS | Status: DC
Start: 2020-06-17 — End: 2020-06-18
  Administered 2020-06-17: 7 mg via INTRAVENOUS
  Administered 2020-06-17: 2 mL via INTRAVENOUS
  Administered 2020-06-17: 5 mL via INTRAVENOUS
  Administered 2020-06-18: 6 mg via INTRAVENOUS
  Administered 2020-06-18: 2 mg via INTRAVENOUS
  Administered 2020-06-18: 0 mg via INTRAVENOUS
  Filled 2020-06-17: qty 30

## 2020-06-17 MED ORDER — DIPHENHYDRAMINE HCL 12.5 MG/5ML PO ELIX: 12.5000 mg | ORAL_SOLUTION | Freq: Four times a day (QID) | ORAL | Status: DC | PRN

## 2020-06-17 MED ORDER — BUPIVACAINE HCL (PF) 0.5 % IJ SOLN
INTRAMUSCULAR | Status: AC
  Filled 2020-06-17: qty 30

## 2020-06-17 MED ORDER — ONDANSETRON HCL 4 MG/2ML IJ SOLN: 4.0000 mg | Freq: Four times a day (QID) | INTRAMUSCULAR | Status: DC | PRN

## 2020-06-17 MED ORDER — ROCURONIUM BROMIDE 10 MG/ML (PF) SYRINGE
PREFILLED_SYRINGE | INTRAVENOUS | Status: DC | PRN
  Administered 2020-06-17: 70 mg via INTRAVENOUS

## 2020-06-17 MED ORDER — AMISULPRIDE (ANTIEMETIC) 5 MG/2ML IV SOLN: 10.0000 mg | Freq: Once | INTRAVENOUS | Status: DC | PRN

## 2020-06-17 MED ORDER — ONDANSETRON HCL 4 MG/2ML IJ SOLN
INTRAMUSCULAR | Status: DC | PRN
  Administered 2020-06-17: 4 mg via INTRAVENOUS

## 2020-06-17 MED ORDER — KETOROLAC TROMETHAMINE 15 MG/ML IJ SOLN
15.0000 mg | INTRAMUSCULAR | Status: AC
  Administered 2020-06-17: 15 mg via INTRAVENOUS
  Filled 2020-06-17: qty 1

## 2020-06-17 MED ORDER — CHLORHEXIDINE GLUCONATE 0.12 % MT SOLN
15.0000 mL | Freq: Once | OROMUCOSAL | Status: AC
  Administered 2020-06-17: 15 mL via OROMUCOSAL
  Filled 2020-06-17: qty 15

## 2020-06-17 MED ORDER — SIMETHICONE 80 MG PO CHEW
80.0000 mg | CHEWABLE_TABLET | Freq: Four times a day (QID) | ORAL | Status: DC | PRN
  Administered 2020-06-17 – 2020-06-18 (×2): 80 mg via ORAL
  Filled 2020-06-17 (×2): qty 1

## 2020-06-17 MED ORDER — FENTANYL CITRATE (PF) 100 MCG/2ML IJ SOLN
INTRAMUSCULAR | Status: DC | PRN
  Administered 2020-06-17 (×3): 50 ug via INTRAVENOUS
  Administered 2020-06-17: 150 ug via INTRAVENOUS
  Administered 2020-06-17: 50 ug via INTRAVENOUS

## 2020-06-17 MED ORDER — POVIDONE-IODINE 10 % EX SWAB
2.0000 "application " | Freq: Once | CUTANEOUS | Status: AC
  Administered 2020-06-17: 2 via TOPICAL

## 2020-06-17 MED ORDER — SUGAMMADEX SODIUM 200 MG/2ML IV SOLN
INTRAVENOUS | Status: DC | PRN
  Administered 2020-06-17: 200 mg via INTRAVENOUS

## 2020-06-17 MED ORDER — ACETAMINOPHEN 500 MG PO TABS
1000.0000 mg | ORAL_TABLET | Freq: Once | ORAL | Status: AC
  Administered 2020-06-17: 1000 mg via ORAL
  Filled 2020-06-17: qty 2

## 2020-06-17 MED ORDER — KETOROLAC TROMETHAMINE 30 MG/ML IJ SOLN: 30.0000 mg | Freq: Once | INTRAMUSCULAR | Status: DC

## 2020-06-17 MED ORDER — ONDANSETRON HCL 4 MG PO TABS: 4.0000 mg | ORAL_TABLET | Freq: Four times a day (QID) | ORAL | Status: DC | PRN

## 2020-06-17 MED ORDER — ONDANSETRON HCL 4 MG/2ML IJ SOLN
INTRAMUSCULAR | Status: AC
  Filled 2020-06-17: qty 2

## 2020-06-17 MED ORDER — ACETAMINOPHEN 325 MG PO TABS: 650.0000 mg | ORAL_TABLET | ORAL | Status: DC | PRN

## 2020-06-17 MED ORDER — ZOLPIDEM TARTRATE 5 MG PO TABS
5.0000 mg | ORAL_TABLET | Freq: Every evening | ORAL | Status: DC | PRN
Start: 2020-06-17 — End: 2020-06-19

## 2020-06-17 MED ORDER — DIPHENHYDRAMINE HCL 50 MG/ML IJ SOLN: 12.5000 mg | Freq: Four times a day (QID) | INTRAMUSCULAR | Status: DC | PRN

## 2020-06-17 MED ORDER — MIDAZOLAM HCL 5 MG/5ML IJ SOLN
INTRAMUSCULAR | Status: DC | PRN
  Administered 2020-06-17: 2 mg via INTRAVENOUS

## 2020-06-17 MED ORDER — OXYCODONE HCL 5 MG PO TABS
5.0000 mg | ORAL_TABLET | ORAL | Status: DC | PRN
  Administered 2020-06-17 – 2020-06-18 (×2): 10 mg via ORAL
  Administered 2020-06-18 – 2020-06-19 (×3): 5 mg via ORAL
  Filled 2020-06-17: qty 1
  Filled 2020-06-17: qty 2
  Filled 2020-06-17 (×2): qty 1

## 2020-06-17 MED ORDER — DEXAMETHASONE SODIUM PHOSPHATE 10 MG/ML IJ SOLN
INTRAMUSCULAR | Status: DC | PRN
  Administered 2020-06-17: 10 mg via INTRAVENOUS

## 2020-06-17 MED ORDER — LACTATED RINGERS IV SOLN
INTRAVENOUS | Status: DC
  Administered 2020-06-17: 125 mL via INTRAVENOUS

## 2020-06-17 MED ORDER — NALOXONE HCL 0.4 MG/ML IJ SOLN: 0.4000 mg | INTRAMUSCULAR | Status: DC | PRN

## 2020-06-17 MED ORDER — IBUPROFEN 800 MG PO TABS
800.0000 mg | ORAL_TABLET | Freq: Three times a day (TID) | ORAL | Status: DC
  Administered 2020-06-17 – 2020-06-19 (×6): 800 mg via ORAL
  Filled 2020-06-17 (×6): qty 1

## 2020-06-17 MED ORDER — OXYCODONE HCL 5 MG PO TABS
ORAL_TABLET | ORAL | Status: AC
  Filled 2020-06-17: qty 2

## 2020-06-17 MED ORDER — SOD CITRATE-CITRIC ACID 500-334 MG/5ML PO SOLN
30.0000 mL | ORAL | Status: AC
  Administered 2020-06-17: 30 mL via ORAL
  Filled 2020-06-17: qty 15

## 2020-06-17 SURGICAL SUPPLY — 36 items
BARRIER ADHS 3X4 INTERCEED (GAUZE/BANDAGES/DRESSINGS) ×4 IMPLANT
CANISTER SUCT 3000ML PPV (MISCELLANEOUS) ×8 IMPLANT
COVER WAND RF STERILE (DRAPES) ×4 IMPLANT
DRAPE WARM FLUID 44X44 (DRAPES) IMPLANT
DRSG OPSITE POSTOP 4X10 (GAUZE/BANDAGES/DRESSINGS) ×8 IMPLANT
DURAPREP 26ML APPLICATOR (WOUND CARE) ×4 IMPLANT
ELECT NEEDLE TIP 2.8 STRL (NEEDLE) ×4 IMPLANT
GAUZE 4X4 16PLY RFD (DISPOSABLE) ×4 IMPLANT
GLOVE BIO SURGEON STRL SZ 6.5 (GLOVE) ×4 IMPLANT
GLOVE SURG UNDER POLY LF SZ7 (GLOVE) ×12 IMPLANT
GOWN STRL REUS W/ TWL LRG LVL3 (GOWN DISPOSABLE) ×9 IMPLANT
GOWN STRL REUS W/TWL LRG LVL3 (GOWN DISPOSABLE) ×3
HEMOSTAT ARISTA ABSORB 3G PWDR (HEMOSTASIS) ×4 IMPLANT
KIT TURNOVER KIT B (KITS) ×4 IMPLANT
NEEDLE HYPO 22GX1.5 SAFETY (NEEDLE) ×4 IMPLANT
NS IRRIG 1000ML POUR BTL (IV SOLUTION) ×4 IMPLANT
PACK ABDOMINAL GYN (CUSTOM PROCEDURE TRAY) ×4 IMPLANT
PAD ARMBOARD 7.5X6 YLW CONV (MISCELLANEOUS) ×4 IMPLANT
PAD OB MATERNITY 4.3X12.25 (PERSONAL CARE ITEMS) ×4 IMPLANT
PENCIL SMOKE EVAC W/HOLSTER (ELECTROSURGICAL) ×4 IMPLANT
SPONGE LAP 18X18 RF (DISPOSABLE) ×4 IMPLANT
SUT PLAIN 2 0 XLH (SUTURE) ×4 IMPLANT
SUT VIC AB 0 CT1 18XCR BRD8 (SUTURE) IMPLANT
SUT VIC AB 0 CT1 27 (SUTURE) ×1
SUT VIC AB 0 CT1 27XBRD ANBCTR (SUTURE) ×3 IMPLANT
SUT VIC AB 0 CT1 36 (SUTURE) ×8 IMPLANT
SUT VIC AB 0 CT1 8-18 (SUTURE)
SUT VIC AB 2-0 CT1 27 (SUTURE)
SUT VIC AB 2-0 CT1 TAPERPNT 27 (SUTURE) IMPLANT
SUT VIC AB 3-0 SH 27 (SUTURE) ×1
SUT VIC AB 3-0 SH 27X BRD (SUTURE) ×3 IMPLANT
SUT VIC AB 4-0 PS2 27 (SUTURE) ×4 IMPLANT
SUT VICRYL 0 TIES 12 18 (SUTURE) IMPLANT
SYR CONTROL 10ML LL (SYRINGE) ×4 IMPLANT
TOWEL GREEN STERILE FF (TOWEL DISPOSABLE) ×8 IMPLANT
TRAY FOLEY W/BAG SLVR 14FR (SET/KITS/TRAYS/PACK) ×4 IMPLANT

## 2020-06-17 NOTE — Op Note (Addendum)
PROCEDURE DATE:06/17/2020  PREOPERATIVE DIAGNOSIS:  Bilateral dermoid cysts POSTOPERATIVE DIAGNOSIS:  Left dermoid cyst SURGEON:   [name], M.D. ASSISTANT: Margie Ege, M.D. An experienced assistant was required given the standard of surgical care given the complexity of the case. This assistant was needed for exposure, dissection, suctioning, retraction, instrument exchange, and for overall help during the procedure   ANESTHESIOLOGIST: Mathis Fare, M.D. OPERATION:  Exploratory laparotomy,  Left ovarian cystectomy ANESTHESIA:  General endotracheal.  INDICATIONS: The patient is a 30 y.o. G1P1001 with  Previously diagnosed left dermoid cyst and possible right dermoid cyst who desired definitive surgical management. On the preoperative visit, the risks, benefits, indications, and alternatives of the procedure were reviewed with the patient.  On the day of surgery, the risks of surgery were again discussed with the patient including but not limited to: bleeding which may require transfusion or reoperation; infection which may require antibiotics; injury to bowel, bladder, ureters or other surrounding organs; need for additional procedures; thromboembolic phenomenon, incisional problems and other postoperative/anesthesia complications. Written informed consent was obtained.    OPERATIVE FINDINGS: A 8 week size uterus with normal right fallopian tube and ovary.   No other anomalies noted in pelvis, no lesions of endometriosis.    ESTIMATED BLOOD LOSS: 50 ml FLUIDS:  800 ml of Lactated Ringers URINE OUTPUT:  150 ml of clear yellow urine. SPECIMENS: left dermoid cyst COMPLICATIONS:  None immediate.  DESCRIPTION OF PROCEDURE:  The patient received intravenous antibiotics and had sequential compression devices applied to her lower extremities while in the preoperative area.   She was taken to the operating room and placed under general anesthesia without difficulty.The abdomen and perineum were  prepped and draped in a sterile manner, and she was placed in a dorsal supine position.  A Foley catheter was inserted into the bladder and attached to constant drainage. After an adequate timeout was performed, a Pfannensteil skin incision was made. This incision was taken down to the fascia using electrocautery with care given to maintain good hemostasis. The fascia was incised in the midline and the fascial incision was then extended bilaterally using electrocautery without difficulty. The fascia was then dissected off the underlying rectus muscles using blunt and sharp dissection. The rectus muscles were split bluntly in the midline and the peritoneum entered sharply without complication. This peritoneal incision was then extended superiorly and inferiorly with care given to prevent bowel or bladder injury.  Attention was then turned to the pelvis. The  ovary at this point was noted to be mobilized and was delivered up out of the abdomen.  A left dermoid cyst was easily palpated and the ovary was noted to be enlarged.  Using the bovie cautery the serosa of the ovary was entered.  A moderate amount of thick yellow fluid was noted to escape and was suctioned away.  A plane between the cyst wall and the ovary was developed and the dermoid cyst was removed in ites entirety.  Hair and bony particles were palpated.  The ovarian serosa was reapproximated with 3-0 vicryl. And a wedge of interceed was placed over the ovary itself.  The right ovary was palpated and found to be normal. The fascia was also closed in a running fashion with 0 vicryl suture. The subcutaneous layer was reapproximated with 0 plane gut suture in an interrupted fashion. The skin was closed with a 4-0 Vicryl on a  subcuticular stitch. Sponge, lap, needle, and instrument counts were correct times three. The patient was taken to  the recovery area awake, extubated and in stable condition.  [name], MD, [place] Attending [place], [facility]

## 2020-06-17 NOTE — Anesthesia Postprocedure Evaluation (Signed)
Anesthesia Post Note  Patient: Tara Mathews  Procedure(s) Performed: LAPAROTOMY (N/A ) OPEN OVARIAN CYSTECTOMY (Left Abdomen)     Patient location during evaluation: PACU Anesthesia Type: General Level of consciousness: sedated Pain management: pain level controlled Vital Signs Assessment: post-procedure vital signs reviewed and stable Respiratory status: spontaneous breathing and respiratory function stable Cardiovascular status: stable Postop Assessment: no apparent nausea or vomiting Anesthetic complications: no   No complications documented.  Last Vitals:  Vitals:   06/17/20 0943 06/17/20 0950  BP:  110/64  Pulse:  79  Resp: 15 15  Temp:  36.6 C  SpO2: 98% 99%    Last Pain:  Vitals:   06/17/20 0943  TempSrc:   PainSc: 7                  Tara Mathews DANIEL

## 2020-06-17 NOTE — Plan of Care (Signed)
Pt will follow post op plan of care

## 2020-06-17 NOTE — Transfer of Care (Signed)
Immediate Anesthesia Transfer of Care Note  Patient: Tara Mathews  Procedure(s) Performed: LAPAROTOMY (N/A ) OPEN OVARIAN CYSTECTOMY (Left Abdomen)  Patient Location: PACU  Anesthesia Type:General  Level of Consciousness: awake, alert , oriented, patient cooperative and responds to stimulation  Airway & Oxygen Therapy: Patient Spontanous Breathing  Post-op Assessment: Report given to RN and Post -op Vital signs reviewed and stable  Post vital signs: Reviewed and stable  Last Vitals:  Vitals Value Taken Time  BP 109/97 06/17/20 0904  Temp    Pulse 103 06/17/20 0906  Resp 18 06/17/20 0906  SpO2 88 % 06/17/20 0906  Vitals shown include unvalidated device data.  Last Pain:  Vitals:   06/17/20 0643  TempSrc: Oral  PainSc:          Complications: No complications documented.

## 2020-06-17 NOTE — Interval H&P Note (Signed)
History and Physical Interval Note:  06/17/2020 7:15 AM  Tara Mathews  has presented today for surgery, with the diagnosis of Dermoid Cyst.  The various methods of treatment have been discussed with the patient and family. After consideration of risks, benefits and other options for treatment, the patient has consented to  Procedure(s): LAPAROTOMY (N/A) OPEN OVARIAN CYSTECTOMY (Bilateral) OOPHORECTOMY (Left) as a surgical intervention.  The patient's history has been reviewed, patient examined, no change in status, stable for surgery.  I have reviewed the patient's chart and labs.  Questions were answered to the patient's satisfaction.    CT and ultrasounds reviewed.  Left cyst is larger than the right.  Risks and benefits of surgery reviewed including bleeding, infection, involvement of other ovaries including bladder and bowel.  Pt wishes to proceed.   Griffin Basil, MD

## 2020-06-17 NOTE — Anesthesia Procedure Notes (Signed)
Procedure Name: Intubation Date/Time: 06/17/2020 7:42 AM Performed by: Inda Coke, CRNA Pre-anesthesia Checklist: Patient identified, Emergency Drugs available, Suction available and Patient being monitored Patient Re-evaluated:Patient Re-evaluated prior to induction Oxygen Delivery Method: Circle System Utilized Preoxygenation: Pre-oxygenation with 100% oxygen Induction Type: IV induction Ventilation: Mask ventilation without difficulty and Oral airway inserted - appropriate to patient size Laryngoscope Size: Mac and 3 Grade View: Grade II Tube type: Oral Tube size: 7.0 mm Number of attempts: 1 Airway Equipment and Method: Stylet and Oral airway Placement Confirmation: ETT inserted through vocal cords under direct vision,  positive ETCO2 and breath sounds checked- equal and bilateral Secured at: 22 cm Tube secured with: Tape Dental Injury: Teeth and Oropharynx as per pre-operative assessment

## 2020-06-17 NOTE — Plan of Care (Signed)
  Problem: Education: Goal: Knowledge of General Education information will improve Description Including pain rating scale, medication(s)/side effects and non-pharmacologic comfort measures Outcome: Progressing   

## 2020-06-18 ENCOUNTER — Encounter (HOSPITAL_COMMUNITY): Payer: Self-pay | Admitting: Obstetrics and Gynecology

## 2020-06-18 LAB — CBC
HCT: 32.9 % — ABNORMAL LOW (ref 36.0–46.0)
Hemoglobin: 10.6 g/dL — ABNORMAL LOW (ref 12.0–15.0)
MCH: 28.4 pg (ref 26.0–34.0)
MCHC: 32.2 g/dL (ref 30.0–36.0)
MCV: 88.2 fL (ref 80.0–100.0)
Platelets: 271 10*3/uL (ref 150–400)
RBC: 3.73 MIL/uL — ABNORMAL LOW (ref 3.87–5.11)
RDW: 12.7 % (ref 11.5–15.5)
WBC: 11.4 10*3/uL — ABNORMAL HIGH (ref 4.0–10.5)
nRBC: 0 % (ref 0.0–0.2)

## 2020-06-18 LAB — BASIC METABOLIC PANEL
Anion gap: 8 (ref 5–15)
BUN: 5 mg/dL — ABNORMAL LOW (ref 6–20)
CO2: 24 mmol/L (ref 22–32)
Calcium: 8.5 mg/dL — ABNORMAL LOW (ref 8.9–10.3)
Chloride: 105 mmol/L (ref 98–111)
Creatinine, Ser: 0.55 mg/dL (ref 0.44–1.00)
GFR, Estimated: >60 mL/min (ref >60)
Glucose, Bld: 113 mg/dL — ABNORMAL HIGH (ref 70–99)
Potassium: 3.9 mmol/L (ref 3.5–5.1)
Sodium: 137 mmol/L (ref 135–145)

## 2020-06-18 LAB — SURGICAL PATHOLOGY

## 2020-06-18 NOTE — Progress Notes (Signed)
Subjective: Pt seen.  She is doing well.  Pain well controlled.  She has tolerated regular diet and passed flatus.  Objective: Vital signs in last 24 hours: Temp:  [97.9 F (36.6 C)-98.9 F (37.2 C)] 98.3 F (36.8 C) (03/09 0347) Pulse Rate:  [64-109] 64 (03/09 0347) Resp:  [14-27] 24 (03/09 0349) BP: (96-127)/(50-97) 96/50 (03/09 0347) SpO2:  [94 %-100 %] 100 % (03/09 0349) Weight change:   Intake/Output from previous day: 03/08 0701 - 03/09 0700 In: 3437.5 [P.O.:690; I.V.:2647.5; IV Piggyback:100] Out: 8676 [Urine:3325; Blood:50] Intake/Output this shift: No intake/output data recorded.  General appearance: alert, cooperative and no distress Head: Normocephalic, without obvious abnormality, atraumatic Resp: clear to auscultation bilaterally Cardio: regular rate and rhythm GI: soft, non-tender; bowel sounds normal; no masses,  no organomegaly Incision/Wound:  Honey comb in place, not saturated  Lab Results: Recent Labs    06/17/20 0718 06/17/20 1135  WBC 8.3 14.3*  HGB 12.8 12.6  HCT 40.4 38.7  PLT 318 307   BMET:  Recent Labs    06/17/20 1135  CREATININE 0.62    I have reviewed the patient's current medications.  Assessment/Plan: POD 1 s/p exploratory laparotomy and left ovarian cystectomy D/C foley D/C PCA, begin oral pain meds Ambulate Reassess this afternoon with potential discharge if stable    LOS: 1 day   Griffin Basil 06/18/2020,7:46 AM

## 2020-06-18 NOTE — Progress Notes (Signed)
S: Pt doing well, tolerating regular diet, pain well controlled with oral pain medications.  She continues to have flatus.  Benign findings on pathology.  Pt desires to go home in AM.  O:  Vitals:   06/18/20 1156 06/18/20 1531  BP:  (!) 120/46  Pulse:  60  Resp:  20  Temp:  98.2 F (36.8 C)  SpO2: 98% 98%    A: POD 1 s/p ex lap, left ovarian cystectomy for dermoid cyst Pt requests abdominal binder D/C home in AM

## 2020-06-19 ENCOUNTER — Other Ambulatory Visit: Payer: Self-pay | Admitting: Obstetrics and Gynecology

## 2020-06-19 ENCOUNTER — Other Ambulatory Visit (INDEPENDENT_AMBULATORY_CARE_PROVIDER_SITE_OTHER): Payer: Self-pay | Admitting: Obstetrics and Gynecology

## 2020-06-19 ENCOUNTER — Other Ambulatory Visit: Payer: Self-pay

## 2020-06-19 ENCOUNTER — Telehealth: Payer: Self-pay

## 2020-06-19 DIAGNOSIS — D271 Benign neoplasm of left ovary: Secondary | ICD-10-CM

## 2020-06-19 MED ORDER — OXYCODONE HCL 5 MG PO TABS: 5.0000 mg | ORAL_TABLET | ORAL | 0 refills | Status: DC | PRN

## 2020-06-19 MED ORDER — IBUPROFEN 800 MG PO TABS: 800.0000 mg | ORAL_TABLET | Freq: Three times a day (TID) | ORAL | 0 refills | Status: DC

## 2020-06-19 MED ORDER — OXYCODONE HCL 5 MG PO TABS: 5.0000 mg | ORAL_TABLET | ORAL | 0 refills | Status: AC | PRN

## 2020-06-19 MED ORDER — DOCUSATE SODIUM 100 MG PO CAPS: 100.0000 mg | ORAL_CAPSULE | Freq: Two times a day (BID) | ORAL | 0 refills | Status: DC

## 2020-06-19 MED FILL — IBUPROFEN 800 MG TABLET: 800 | 10 days supply | Qty: 30 | Fill #0

## 2020-06-19 NOTE — Progress Notes (Signed)
rx for oxycodone sent to [facility], [facility]

## 2020-06-19 NOTE — Telephone Encounter (Signed)
Rec'vd call from pt's pharmacy at [facility] regarding pt's Oxycodone rx.  They do not dispense narcotics.  Consulted with MD, rx will be sent to [facility] on [address]. Pt notified and agreed.

## 2020-06-19 NOTE — Discharge Summary (Signed)
Physician Discharge Summary  Patient ID: Tara Mathews MRN: [id number] DOB/AGE: Jul 25, 1990 30 y.o.  Admit date: 06/17/2020 Discharge date: 06/19/2020  Admission Diagnoses: left dermoid cyst  Discharge Diagnoses:  Active Problems:   Dermoid cyst of ovary   Discharged Condition: good  Hospital Course: Pt was admitted for exploratory laparotomy with left ovarian cystectomy.  Please see operative note.  Pt had morphine PCA the first evening and rested without difficulty.  PCA and foley catheter were discontinued on POD 1.  Pt was tolerating regular diet and voiding without difficulty; however, she was still a little sore and requested discharge on POD 2.  Pt continued to do well and was discharged home on POD #2.  Consults: None  Significant Diagnostic Studies: labs: CBC  Treatments: procedures: exploratory laparotomy, left ovarian cystectomy  Discharge Exam: Blood pressure (!) 131/58, pulse 72, temperature 98.2 F (36.8 C), temperature source Oral, resp. rate 18, height 5' (1.524 m), weight 98.4 kg, last menstrual period 05/21/2020, SpO2 99 %, not currently breastfeeding. General appearance: alert, cooperative, no distress and moderately obese Head: Normocephalic, without obvious abnormality, atraumatic Resp: clear to auscultation bilaterally Cardio: regular rate and rhythm GI: soft, non-tender; bowel sounds normal; no masses,  no organomegaly Extremities: extremities normal, atraumatic, no cyanosis or edema and Homans sign is negative, no sign of DVT Incision/Wound:  Disposition: Discharge disposition: 01-Home or Self Care       Discharge Instructions     Remove dressing in 72 hours   Complete by: As directed    Remove on Sunday   Call MD for:  difficulty breathing, headache or visual disturbances   Complete by: As directed    Call MD for:  persistant dizziness or light-headedness   Complete by: As directed    Call MD for:  persistant nausea and vomiting    Complete by: As directed    Call MD for:  redness, tenderness, or signs of infection (pain, swelling, redness, odor or green/yellow discharge around incision site)   Complete by: As directed    Call MD for:  severe uncontrolled pain   Complete by: As directed    Call MD for:  temperature >100.4   Complete by: As directed    Diet - low sodium heart healthy   Complete by: As directed    Driving Restrictions   Complete by: As directed    No driving for 1-2 weeks or if still taking narcotic medication   Increase activity slowly   Complete by: As directed    Lifting restrictions   Complete by: As directed    No lifting more than 10-15 pounds   Sexual Activity Restrictions   Complete by: As directed    Pelvic rest 1 month     Allergies as of 06/19/2020      Reactions   Bactrim [sulfamethoxazole-trimethoprim] Hives      Medication List    TAKE these medications   docusate sodium 100 MG capsule Commonly known as: COLACE Take 1 capsule (100 mg total) by mouth 2 (two) times daily.   ibuprofen 800 MG tablet Commonly known as: ADVIL Take 1 tablet (800 mg total) by mouth every 8 (eight) hours.   omeprazole 40 MG capsule Commonly known as: PRILOSEC Take 1 capsule (40 mg total) by mouth daily.   oxyCODONE 5 MG immediate release tablet Commonly known as: Oxy IR/ROXICODONE Take 1-2 tablets (5-10 mg total) by mouth every 4 (four) hours as needed for moderate pain.  Follow-up Information    Griffin Basil, MD. Schedule an appointment as soon as possible for a visit in 1 week(s).   Specialty: Obstetrics and Gynecology Why: wound check at [facility] information: [place] [address] [phone]               Signed: Griffin Basil 06/19/2020, 9:24 AM

## 2020-06-19 NOTE — Discharge Instructions (Signed)
Exploratory Laparotomy, Adult, Care After The following information offers guidance on how to care for yourself after your procedure. Your health care provider may also give you more specific instructions. If you have problems or questions, contact your health care provider. What can I expect after the procedure? After the procedure, it is common to have:  Abdominal soreness.  Fatigue.  Bloating.  Gas.  A sore throat from having had a breathing or draining tube in your throat.  A lack of appetite. Follow these instructions at home: Medicines  Take over-the-counter and prescription medicines only as told by your health care provider.  If you were prescribed an antibiotic medicine, take it as told by your health care provider. Do not stop taking the antibiotic even if you start to feel better.  Ask your health care provider if the medicine prescribed to you: ? Requires you to avoid driving or using machinery. ? Can cause constipation. You may need to take these actions to prevent or treat constipation:  Drink enough fluid to keep your urine pale yellow.  Take over-the-counter or prescription medicines. Undergoing surgery and taking pain medicines can make constipation worse.  Eat foods that are high in fiber, such as beans, whole grains, and fresh fruits and vegetables.  Limit foods that are high in fat and processed sugars, such as fried or sweet foods. Incision care  Follow instructions from your health care provider about how to take care of your incision. Make sure you: ? Wash your hands with soap and water for at least 20 seconds before and after you change your bandage (dressing). If soap and water are not available, use hand sanitizer. ? Change your dressing as told by your health care provider. ? Leave stitches (sutures), skin glue, or adhesive strips in place. These skin closures may need to stay in place for 2 weeks or longer. If adhesive strip edges start to loosen and  curl up, you may trim the loose edges. Do not remove adhesive strips completely unless your health care provider tells you to do that.  If you were sent home with a drain, follow instructions from your health care provider about how to care for it.  Check your incision area every day for signs of infection. Check for: ? Redness, swelling, or pain. ? Fluid or blood. ? Warmth. ? Pus or a bad smell.   Activity  Rest as told by your health care provider.  Avoid sitting for a long time without moving. Get up to take short walks every 1-2 hours. This is important to improve blood flow and breathing. Ask for help if you feel weak or unsteady.  Do not lift anything that is heavier than 5 lb (2.3 kg), or the limit that you are told, until your health care provider says that it is safe.  Return to your normal activities as told by your health care provider. Ask your health care provider what activities are safe for you.   Bathing Keep your incision clean and dry. Clean it as often as told by your health care provider. You may be told to:  Gently wash the incision with soap and water.  Rinse the incision with water to remove all soap.  Pat the incision dry with a clean towel. Do not rub the incision. General instructions  Do not use any products that contain nicotine or tobacco. These products include cigarettes, chewing tobacco, and vaping devices, such as e-cigarettes. These can delay incision healing after surgery. If you   need help quitting, ask your health care provider.  Wear compression stockings as told by your health care provider. These stockings help to prevent blood clots and reduce swelling in your legs.  Keep all follow-up visits. This is important. Contact a health care provider if:  You have a fever or chills.  Your pain medicine is not helping.  You have constipation or diarrhea.  You have nausea or vomiting.  You have drainage, redness, swelling, or pain at your  incision site. Get help right away if:  Your pain is getting worse.  You have not had a bowel movement for more than 3 days.  You have ongoing (persistent) vomiting.  The edges of your incision open up.  You have warmth, tenderness, or swelling in your calf.  You have trouble breathing.  You have chest pain. These symptoms may represent a serious problem that is an emergency. Do not wait to see if the symptoms will go away. Get medical help right away. Call your local emergency services (911 in the [place] Do not drive yourself to the hospital. Summary  Abdominal soreness is common after exploratory laparotomy. Take over-the-counter and prescription medicines only as told by your health care provider.  Follow instructions from your health care provider about how to take care of your incision.  Do not lift anything that is heavier than 5 lb (2.3 kg), or the limit that you are told, until your health care provider says that it is safe. This information is not intended to replace advice given to you by your health care provider. Make sure you discuss any questions you have with your health care provider. Document Revised: 12/11/2019 Document Reviewed: 12/11/2019 Elsevier Patient Education  2021 Elsevier Inc.  

## 2020-06-20 ENCOUNTER — Telehealth: Payer: Self-pay

## 2020-06-20 NOTE — Telephone Encounter (Signed)
Transition Care Management Follow-up Telephone Call  Date of discharge and from where:[facility] 06/19/2020 How have you been since you were released from the hospital? "sore" Any questions or concerns? No questions/concerns reported.  Items Reviewed: Did the pt receive and understand the discharge instructions provided? Stated that have the instructions and have no questions.  Medications obtained and verified? She said that they have the medication list  and the hospital staff reviewed them in detail prior to discharge. She said that he has all of the medications and  have no questions.  Any new allergies since your discharge? None reported  Do you have support at home? Yes, family  Other (ie: DME, Home Health, etc)       Functional Questionnaire: (I = Independent and D = Dependent) ADL's:  Independent.      Follow up appointments reviewed:    PCP Hospital f/u appt confirmed?Yes appt made with NP Geryl Rankins 06/27/2020 @ 1050.   [facility] f/u appt confirmed? None scheduled at this time   Are transportation arrangements needed?  have transportation    If their condition worsens, is the pt aware to call  their PCP or go to the ED? Yes.Made pt aware if  start experiencing rapid fevers, weight gain, chest pain, diff breathing, SOB,  or bleeding drom the wound to refer imediately to ED for further evaluation.   Was the patient provided with contact information for the PCP's office or ED? She has the phone number for the clinic.  Was the pt encouraged to call back with questions or concerns?yes

## 2020-06-23 ENCOUNTER — Encounter: Payer: Self-pay | Admitting: Nurse Practitioner

## 2020-06-23 ENCOUNTER — Telehealth: Payer: Self-pay

## 2020-06-23 NOTE — Telephone Encounter (Signed)
Pt c/o dysuria and blood in urine since this morning, denies VB. Pt has post op f/u appt tomorrow afternoon.  Changed appt from afternoon to a morning visit.  Pt to contact the office or report to the hospital after hours, if sx's worsen.

## 2020-06-24 ENCOUNTER — Encounter: Payer: No Typology Code available for payment source | Admitting: Obstetrics and Gynecology

## 2020-06-24 ENCOUNTER — Encounter: Payer: Self-pay | Admitting: Obstetrics and Gynecology

## 2020-06-24 ENCOUNTER — Ambulatory Visit (INDEPENDENT_AMBULATORY_CARE_PROVIDER_SITE_OTHER): Payer: Self-pay | Admitting: Obstetrics and Gynecology

## 2020-06-24 ENCOUNTER — Other Ambulatory Visit: Payer: Self-pay

## 2020-06-24 DIAGNOSIS — Z4889 Encounter for other specified surgical aftercare: Secondary | ICD-10-CM

## 2020-06-24 NOTE — Progress Notes (Signed)
   Subjective:    Patient ID: Tara Mathews, female    DOB: 1990/08/21, 30 y.o.   MRN: [id number]  HPI Pt seen for 1 week wound check.  She has been doing well at home.  No issues with voiding/bowel movements.  Pain is well controlled.   Review of Systems     Objective:   Physical Exam Skin:    Comments: Wound: clean, dry and intact.  Small amount of bruising just anterior to the incision.  No wound breakdown noted.    Vitals:   06/24/20 1049  BP: 123/84  Pulse: 84         Assessment & Plan:   1. Encounter for post surgical wound check Wound healing well, no defects  F/u in 3-4 weeks for formal postop visit.    Griffin Basil, MD Faculty Attending, [facility]

## 2020-06-24 NOTE — Progress Notes (Signed)
Patient present for post op follow up. Patient states that she noticed some bleeding with urination yesterday, but not today. Bleeding may be from her cycle which she states that she started 06/21/20. Rates pain today as a 4/10. She denies having any urinary symptoms.

## 2020-06-27 ENCOUNTER — Other Ambulatory Visit: Payer: Self-pay

## 2020-06-27 ENCOUNTER — Encounter: Payer: Self-pay | Admitting: Nurse Practitioner

## 2020-06-27 ENCOUNTER — Ambulatory Visit: Payer: Self-pay | Attending: Nurse Practitioner | Admitting: Nurse Practitioner

## 2020-06-27 DIAGNOSIS — Z09 Encounter for follow-up examination after completed treatment for conditions other than malignant neoplasm: Secondary | ICD-10-CM

## 2020-06-27 NOTE — Progress Notes (Signed)
Virtual Visit via Telephone Note Due to national recommendations of social distancing due to [place] 19, telehealth visit is felt to be most appropriate for this patient at this time.  I discussed the limitations, risks, security and privacy concerns of performing an evaluation and management service by telephone and the availability of in person appointments. I also discussed with the patient that there may be a patient responsible charge related to this service. The patient expressed understanding and agreed to proceed.    I connected with Tara Mathews on 06/27/20  at  10:50 AM EDT  EDT by telephone and verified that I am speaking with the correct person using two identifiers.   Consent I discussed the limitations, risks, security and privacy concerns of performing an evaluation and management service by telephone and the availability of in person appointments. I also discussed with the patient that there may be a patient responsible charge related to this service. The patient expressed understanding and agreed to proceed.   Location of Patient: Private residence   Location of Provider: [place] and [place] participating in Telemedicine visit: Geryl Rankins FNP-BC [place]    History of Present Illness: Telemedicine visit for: Post op follow up   Post op day 10: laparotomy with left open ovarian cystectomy. History of left ovarian dermoid cyst.  She had post op visit with dermatology on 06-24-2020. Doing well  aside from surgical incision pain. She has f/u with GYN in April.   States her son had a stomach bug and on Monday she began to have nausea, vomiting and diarrhea. She took OTC medications and reports symptoms have lessened. Past Medical History:  Diagnosis Date  . Anxiety     Past Surgical History:  Procedure Laterality Date  . LAPAROTOMY N/A 06/17/2020   Procedure: LAPAROTOMY;  Surgeon: Griffin Basil,  MD;  Location: [facility];  Service: Gynecology;  Laterality: N/A;  . NO PAST SURGERIES    . OVARIAN CYST REMOVAL Left 06/17/2020   Procedure: OPEN OVARIAN CYSTECTOMY;  Surgeon: Griffin Basil, MD;  Location: [facility];  Service: Gynecology;  Laterality: Left;    Family History  Problem Relation Age of Onset  . Miscarriages / Stillbirths Maternal Aunt   . Cancer Maternal Uncle        Brain tumor  . Diabetes Paternal Grandmother   . Obesity Mother   . Arthritis Father   . Hypertension Father   . Obesity Father     Social History   Socioeconomic History  . Marital status: Single    Spouse name: Not on file  . Number of children: 1  . Years of education: Not on file  . Highest education level: Associate degree: occupational, Hotel manager, or vocational program  Occupational History  . Occupation: UNEMPLOYED  Tobacco Use  . Smoking status: Never Smoker  . Smokeless tobacco: Never Used  Vaping Use  . Vaping Use: Never used  Substance and Sexual Activity  . Alcohol use: No  . Drug use: No  . Sexual activity: Yes    Birth control/protection: None    Comment: 1st intercourse 30 yo-Fewer than 5 partners  Other Topics Concern  . Not on file  Social History Narrative  . Not on file   Social Determinants of Health   Financial Resource Strain: Not on file  Food Insecurity: Not on file  Transportation Needs: No Transportation Needs  . Lack of Transportation (Medical): No  .  Lack of Transportation (Non-Medical): No  Physical Activity: Not on file  Stress: Not on file  Social Connections: Not on file     Observations/Objective: Awake, alert and oriented x 3   Review of Systems  Constitutional: Negative for fever, malaise/fatigue and weight loss.  HENT: Negative.  Negative for nosebleeds.   Eyes: Negative.  Negative for blurred vision, double vision and photophobia.  Respiratory: Negative.  Negative for cough and shortness of breath.   Cardiovascular: Negative.  Negative for chest  pain, palpitations and leg swelling.  Gastrointestinal: Negative.  Negative for heartburn, nausea and vomiting.  Musculoskeletal: Negative.  Negative for myalgias.  Neurological: Negative.  Negative for dizziness, focal weakness, seizures and headaches.  Psychiatric/Behavioral: Negative.  Negative for suicidal ideas.    Assessment and Plan: Tara Mathews was seen today for hospitalization follow-up.  Diagnoses and all orders for this visit:  Hospital discharge follow-up F/U with GYN as instructed.    Follow Up Instructions Return if symptoms worsen or fail to improve.     I discussed the assessment and treatment plan with the patient. The patient was provided an opportunity to ask questions and all were answered. The patient agreed with the plan and demonstrated an understanding of the instructions.   The patient was advised to call back or seek an in-person evaluation if the symptoms worsen or if the condition fails to improve as anticipated.  I provided 11 minutes of non-face-to-face time during this encounter including median intraservice time, reviewing previous notes, labs, imaging, medications and explaining diagnosis and management.  Gildardo Pounds, FNP-BC

## 2020-06-27 NOTE — Progress Notes (Signed)
Pt states her pain is coming from her incision

## 2020-06-30 ENCOUNTER — Emergency Department (HOSPITAL_COMMUNITY)
Admission: EM | Admit: 2020-06-30 | Discharge: 2020-06-30 | Disposition: A | Discharge status: Home / Self Care | Payer: No Typology Code available for payment source | Attending: Emergency Medicine | Admitting: Emergency Medicine

## 2020-06-30 DIAGNOSIS — Z9889 Other specified postprocedural states: Secondary | ICD-10-CM | POA: Insufficient documentation

## 2020-06-30 DIAGNOSIS — T8130XA Disruption of wound, unspecified, initial encounter: Secondary | ICD-10-CM

## 2020-06-30 DIAGNOSIS — T8131XA Disruption of external operation (surgical) wound, not elsewhere classified, initial encounter: Secondary | ICD-10-CM | POA: Insufficient documentation

## 2020-06-30 LAB — COMPREHENSIVE METABOLIC PANEL
ALT: 67 U/L — ABNORMAL HIGH (ref 0–44)
AST: 29 U/L (ref 15–41)
Albumin: 3.7 g/dL (ref 3.5–5.0)
Alkaline Phosphatase: 82 U/L (ref 38–126)
Anion gap: 7 (ref 5–15)
BUN: 17 mg/dL (ref 6–20)
CO2: 24 mmol/L (ref 22–32)
Calcium: 9.5 mg/dL (ref 8.9–10.3)
Chloride: 106 mmol/L (ref 98–111)
Creatinine, Ser: 0.66 mg/dL (ref 0.44–1.00)
GFR, Estimated: >60 mL/min (ref >60)
Glucose, Bld: 109 mg/dL — ABNORMAL HIGH (ref 70–99)
Potassium: 4.2 mmol/L (ref 3.5–5.1)
Sodium: 137 mmol/L (ref 135–145)
Total Bilirubin: 0.3 mg/dL (ref 0.3–1.2)
Total Protein: 7.4 g/dL (ref 6.5–8.1)

## 2020-06-30 LAB — CBC WITH DIFFERENTIAL/PLATELET
Abs Immature Granulocytes: 0.04 10*3/uL (ref 0.00–0.07)
Basophils Absolute: 0.1 10*3/uL (ref 0.0–0.1)
Basophils Relative: 0 %
Eosinophils Absolute: 0.3 10*3/uL (ref 0.0–0.5)
Eosinophils Relative: 3 %
HCT: 40.2 % (ref 36.0–46.0)
Hemoglobin: 12.8 g/dL (ref 12.0–15.0)
Immature Granulocytes: 0 %
Lymphocytes Relative: 34 %
Lymphs Abs: 3.8 10*3/uL (ref 0.7–4.0)
MCH: 28.2 pg (ref 26.0–34.0)
MCHC: 31.8 g/dL (ref 30.0–36.0)
MCV: 88.5 fL (ref 80.0–100.0)
Monocytes Absolute: 0.7 10*3/uL (ref 0.1–1.0)
Monocytes Relative: 6 %
Neutro Abs: 6.5 10*3/uL (ref 1.7–7.7)
Neutrophils Relative %: 57 %
Platelets: 435 10*3/uL — ABNORMAL HIGH (ref 150–400)
RBC: 4.54 MIL/uL (ref 3.87–5.11)
RDW: 12.1 % (ref 11.5–15.5)
WBC: 11.3 10*3/uL — ABNORMAL HIGH (ref 4.0–10.5)
nRBC: 0 % (ref 0.0–0.2)

## 2020-06-30 LAB — I-STAT BETA HCG BLOOD, ED (MC, WL, AP ONLY): I-stat hCG, quantitative: <5 m[IU]/mL (ref <5)

## 2020-06-30 LAB — URINALYSIS, ROUTINE W REFLEX MICROSCOPIC
Bilirubin Urine: NEGATIVE
Glucose, UA: NEGATIVE mg/dL
Ketones, ur: NEGATIVE mg/dL
Leukocytes,Ua: NEGATIVE
Nitrite: NEGATIVE
Protein, ur: NEGATIVE mg/dL
Specific Gravity, Urine: 1.017 (ref 1.005–1.030)
pH: 7 (ref 5.0–8.0)

## 2020-06-30 MED ORDER — IBUPROFEN 400 MG PO TABS
600.0000 mg | ORAL_TABLET | Freq: Once | ORAL | Status: AC
  Administered 2020-06-30: 600 mg via ORAL
  Filled 2020-06-30: qty 1

## 2020-06-30 NOTE — Discharge Instructions (Signed)
Thank you for allowing me to care for you today in the Emergency Department.   You can take 800 mg of ibuprofen with food every 8 hours for pain.  You can also alternate this with Tylenol.  You can take at 1000 g of Tylenol once every 8 hours.  Keep the Steri-Strips in place until you have been seen by Dr. Elgie Congo.  You can shower with these on.  Call his office to schedule a follow-up appointment.  Return to the emergency department if you develop redness, warmth, red streaking, swelling to the abdominal skin, temperature greater than 100.4, if you have thick, mucus-like drainage from the wound, or other new, concerning symptoms.

## 2020-06-30 NOTE — ED Notes (Signed)
E-signature pad unavailable at time of pt discharge. This RN discussed discharge materials with pt and answered all pt questions. Pt stated understanding of discharge material. ? ?

## 2020-06-30 NOTE — ED Provider Notes (Signed)
[facility] EMERGENCY DEPARTMENT Provider Note   CSN: [id number] Arrival date & time: 06/30/20  0201     History Chief Complaint  Patient presents with  . Post-op Problem    Tara Mathews is a 30 y.o. female with a history of dermoid cyst of the left ovary s/p laparotomy for open ovarian cystectomy on 06/17/20 who presents the emergency department with a chief complaint of abdominal pain.  The patient underwent an abdominal laparotomy for a left ovarian cystectomy on March 8.  She has been seen by her OB/GYN for her 1 week follow-up.  She reports that pain has been significantly improving until today.  She states that she did began to notice pain around the surgical scar, that she describes as "prickly".  No known aggravating or alleviating factors.  Pain is nonradiating.  She notes that the Dermabond on the wound has started to come off and there is a small portion of the wound that appears to be open.  She has noticed a small amount of yellowish drainage that has a fishy smell.  She has been taking ibuprofen at home with good improvement in her pain.  She did have vaginal bleeding that started on March 13 and ended on the 18th and was concerned that this may be related to her surgery, but after speaking with her surgeon was advised that it was most likely her menstrual cycle.  She does report that the bleeding lasted around the time as one of her usual menstrual cycles.  Bleeding has mostly subsided.  She denies fever, chills, chest pain, shortness of breath, vaginal discharge, redness, swelling, red streaking to the surgical wounds, nausea, vomiting, or diarrhea.  She does note that both she and her son were sick with nausea, vomiting, diarrhea related to a GI illness for approximately 24 hours last week.    The history is provided by the patient and medical records. No language interpreter was used.       Past Medical History:  Diagnosis Date  . Anxiety      Patient Active Problem List   Diagnosis Date Noted  . Encounter for post surgical wound check 06/24/2020  . Dermoid cyst of ovary 06/17/2020  . Dermoid cyst of ovary, left 03/12/2020  . Breast mass in female 03/12/2020  . Galactorrhea in female 03/12/2020  . Shoulder dystocia during labor and delivery 05/08/2019  . Labor and delivery, indication for care 05/07/2019  . Preeclampsia 05/07/2019  . Positive GBS test 05/02/2019  . Obesity affecting pregnancy, antepartum 10/18/2018  . Encounter for supervision of normal pregnancy 10/17/2018    Past Surgical History:  Procedure Laterality Date  . LAPAROTOMY N/A 06/17/2020   Procedure: LAPAROTOMY;  Surgeon: Griffin Basil, MD;  Location: [facility];  Service: Gynecology;  Laterality: N/A;  . NO PAST SURGERIES    . OVARIAN CYST REMOVAL Left 06/17/2020   Procedure: OPEN OVARIAN CYSTECTOMY;  Surgeon: Griffin Basil, MD;  Location: [facility];  Service: Gynecology;  Laterality: Left;     OB History    Gravida  1   Para  1   Term  1   Preterm      AB      Living  1     SAB      IAB      Ectopic      Multiple  0   Live Births  1           Family History  Problem Relation Age of Onset  . Miscarriages / Stillbirths Maternal Aunt   . Cancer Maternal Uncle        Brain tumor  . Diabetes Paternal Grandmother   . Obesity Mother   . Arthritis Father   . Hypertension Father   . Obesity Father     Social History   Tobacco Use  . Smoking status: Never Smoker  . Smokeless tobacco: Never Used  Vaping Use  . Vaping Use: Never used  Substance Use Topics  . Alcohol use: No  . Drug use: No    Home Medications Prior to Admission medications   Medication Sig Start Date End Date Taking? Authorizing Provider  ibuprofen (ADVIL) 800 MG tablet Take 1 tablet (800 mg total) by mouth every 8 (eight) hours. Patient taking differently: Take 800 mg by mouth every 8 (eight) hours as needed for headache or moderate pain. 06/19/20  Yes  Griffin Basil, MD  omeprazole (PRILOSEC) 40 MG capsule Take 1 capsule (40 mg total) by mouth daily. 06/04/20 06/30/20 Yes Gildardo Pounds, NP    Allergies    Bactrim [sulfamethoxazole-trimethoprim]  Review of Systems   Review of Systems  Constitutional: Negative for activity change, chills and fever.  HENT: Negative for congestion and sore throat.   Eyes: Negative for visual disturbance.  Respiratory: Negative for shortness of breath.   Cardiovascular: Negative for chest pain.  Gastrointestinal: Positive for abdominal pain. Negative for blood in stool, constipation, nausea and vomiting.  Genitourinary: Negative for dysuria, frequency, urgency, vaginal bleeding, vaginal discharge and vaginal pain.  Musculoskeletal: Negative for back pain, myalgias, neck pain and neck stiffness.  Skin: Positive for wound. Negative for color change and rash.  Allergic/Immunologic: Negative for immunocompromised state.  Neurological: Negative for seizures, syncope, weakness, numbness and headaches.  Psychiatric/Behavioral: Negative for confusion.    Physical Exam Updated Vital Signs BP 123/68   Pulse 83   Temp 98.2 F (36.8 C) (Oral)   Resp (!) 22   LMP 06/21/2020   SpO2 98%   Physical Exam Vitals and nursing note reviewed.  Constitutional:      General: She is not in acute distress.    Appearance: She is not ill-appearing, toxic-appearing or diaphoretic.  HENT:     Head: Normocephalic.  Eyes:     Conjunctiva/sclera: Conjunctivae normal.  Cardiovascular:     Rate and Rhythm: Normal rate and regular rhythm.     Heart sounds: No murmur heard. No friction rub. No gallop.   Pulmonary:     Effort: Pulmonary effort is normal. No respiratory distress.  Abdominal:     General: There is no distension.     Palpations: Abdomen is soft. There is no mass.     Tenderness: There is abdominal tenderness. There is no right CVA tenderness, left CVA tenderness, guarding or rebound.     Hernia: No  hernia is present.     Comments: Abdomen is soft and nondistended.  Minimal tenderness to palpation around the surgical wound. There is a low transverse surgical scar with approximately 1 cm of wound dehiscence noted around the left, third of the wound.  Minimal serosanguineous drainage was noted.  No purulent drainage.  Dermabond is in place to most of the wound.  No evidence of cellulitis or surrounding infection.  Good granuloma tissue formation.  Musculoskeletal:     Cervical back: Neck supple.  Skin:    General: Skin is warm.     Findings: No rash.  Neurological:  Mental Status: She is alert.  Psychiatric:        Behavior: Behavior normal.     ED Results / Procedures / Treatments   Labs (all labs ordered are listed, but only abnormal results are displayed) Labs Reviewed  COMPREHENSIVE METABOLIC PANEL - Abnormal; Notable for the following components:      Result Value   Glucose, Bld 109 (*)    ALT 67 (*)    All other components within normal limits  CBC WITH DIFFERENTIAL/PLATELET - Abnormal; Notable for the following components:   WBC 11.3 (*)    Platelets 435 (*)    All other components within normal limits  URINALYSIS, ROUTINE W REFLEX MICROSCOPIC - Abnormal; Notable for the following components:   Color, Urine STRAW (*)    Hgb urine dipstick SMALL (*)    Bacteria, UA RARE (*)    All other components within normal limits  I-STAT BETA HCG BLOOD, ED ([facility], [facility], [facility] ONLY)    EKG None  Radiology No results found.  Procedures Procedures   Medications Ordered in ED Medications  ibuprofen (ADVIL) tablet 600 mg (600 mg Oral Given 06/30/20 0519)    ED Course  I have reviewed the triage vital signs and the nursing notes.  Pertinent labs & imaging results that were available during my care of the patient were reviewed by me and considered in my medical decision making (see chart for details).    MDM Rules/Calculators/A&P                          30 year old female  with a history of dermoid cyst of the left ovary s/p laparotomy for open ovarian cystectomy on 06/17/20 presents the emergency department with pain around her surgical scar from a laparotomy to remove a dermoid cyst on her left ovary on March 8.  She noticed a small amount of yellowish discharge earlier today as well as a fishy-like odor near the wound.  Vital signs are normal.  Mild leukocytosis, unchanged from previous.  No electrolyte derangements.  No left shift.  On exam, there is approximately a 1 cm area of wound dehiscence that is able to be well approximated.  When a Q-tip was applied, minimal serosanguineous drainage was able to be noted at the site.  No purulent drainage.  There is good granuloma tissue formation.  No evidence of cellulitis or findings suggestive of abscess or seroma.  Spoke with Dr. Harolyn Rutherford, OB/GYN, who recommends reapproximating the site with Steri-Strips and having her follow-up with her OB/GYN.  Steri-Strips applied in the ER.  Will discharge home with outpatient follow-up to OB/GYN.  Final Clinical Impression(s) / ED Diagnoses Final diagnoses:  Abdominal wound dehiscence, initial encounter    Rx / DC Orders ED Discharge Orders    None       Joanne Gavel, PA-C 06/30/20 0527    Ripley Fraise, MD 06/30/20 [phone]

## 2020-06-30 NOTE — ED Triage Notes (Addendum)
Pt reports had surgery Laparotomy about an 11 days ago to remove a cyst on her ovary. Pt states it feels "prickly" and a "litte cut on top of her incision  and smell describe it as a fishy smell and some yellow drainage. Pt denies any fevers, sob, chest pain, V/D

## 2020-07-02 ENCOUNTER — Other Ambulatory Visit: Payer: Self-pay

## 2020-07-02 ENCOUNTER — Ambulatory Visit (INDEPENDENT_AMBULATORY_CARE_PROVIDER_SITE_OTHER): Payer: Self-pay | Admitting: Obstetrics and Gynecology

## 2020-07-02 ENCOUNTER — Encounter: Payer: Self-pay | Admitting: Obstetrics and Gynecology

## 2020-07-02 DIAGNOSIS — L7682 Other postprocedural complications of skin and subcutaneous tissue: Secondary | ICD-10-CM | POA: Insufficient documentation

## 2020-07-02 NOTE — Progress Notes (Signed)
RGYN patient presents for F/U  Recent ER visit on 06/30/20  pt c/o abdominal pain pt denies any bleeding notes drainage and odor.  Pt also notes back pain concerned if she has a UTI. UA was done on 06/30/20 at ER.  Pt wants to discuss all  lab results from ER.

## 2020-07-02 NOTE — Progress Notes (Signed)
   Subjective:    Patient ID: Tara Mathews, female    DOB: 30-May-1990, 30 y.o.   MRN: [id number]  HPI Pt seen as ER follow up for possible wound breakdown.  Pt overall is doing well.  Pt seen in ER with incisional pain.  Small skin disruption noted in ER.  Pt otherwise is doing well. And postsurgical pain has decreased.   Review of Systems     Objective:   Physical Exam Vitals:   07/02/20 1404  BP: 105/73  Pulse: 88   Wound: small skin disruption on left margin of wound, minimal drainage if at all.  Steristrips previously applied.  No erythema or induration noted.      Assessment & Plan:   1. Postoperative complication of skin involving drainage from surgical wound Pt reassured that the wound is healing well.  No s/sx of infection.  Skin disuption will heal over time.  Pt may resume driving.   F/u prn. Griffin Basil, MD Faculty Attending, [facility] for [facility]

## 2020-07-23 ENCOUNTER — Ambulatory Visit (INDEPENDENT_AMBULATORY_CARE_PROVIDER_SITE_OTHER): Payer: No Typology Code available for payment source | Admitting: Obstetrics and Gynecology

## 2020-07-23 ENCOUNTER — Other Ambulatory Visit: Payer: Self-pay

## 2020-07-23 ENCOUNTER — Encounter: Payer: Self-pay | Admitting: Obstetrics and Gynecology

## 2020-07-23 DIAGNOSIS — Z4889 Encounter for other specified surgical aftercare: Secondary | ICD-10-CM

## 2020-07-23 NOTE — Progress Notes (Signed)
Pt is in the office to follow up from visit on 07-02-20 related to post op wound healing. Pt denies any issues with the wound today, reports that it is healing well.

## 2020-07-23 NOTE — Progress Notes (Signed)
    Subjective:    Tara Mathews is a 30 y.o. female who presents to the clinic status post exploratory laparotomy with removal of left dermoid on 06/17/20. The patient is not having any pain.  Eating a regular diet without difficulty. Bowel movements are normal. No other significant postoperative concerns.  The following portions of the patient's history were reviewed and updated as appropriate: allergies, current medications, past family history, past medical history, past social history, past surgical history and problem list..  Last pap smear was normal  on 10/17/20.  Review of Systems Pertinent items are noted in HPI.   Objective:   BP 124/83   Pulse 80   Wt 218 lb 8 oz (99.1 kg)   LMP 06/23/2020   BMI 42.67 kg/m  Constitutional:  Well-developed, well-nourished female in no acute distress.   Skin: Skin is warm and dry, no rash noted, not diaphoretic,no erythema, no pallor.  Cardiovascular: Normal heart rate noted  Respiratory: Effort and breath sounds normal, no problems with respiration noted  Abdomen: Soft, bowel sounds active, non-tender, no abnormal masses  Incision: Healing well, no drainage, no erythema, no hernia, no seroma, no swelling, no dehiscence, incision well approximated  Pelvic:   Deferred   Surgical pathology  Mature cystic teratoma Assessment:   Doing well postoperatively.  Operative findings again reviewed. Pathology report discussed.   Plan:   1. Continue any current medications. 2. Wound care discussed. 3. Activity restrictions: none 4. Anticipated return to work: not applicable. 5. Follow up 8/22 for AE, next pap 2023 6.  Routine preventative health maintenance measures emphasized. Please refer to After Visit Summary for other counseling recommendations.    [name], MD, [place] Attending [place] for [facility] [place]

## 2020-11-26 ENCOUNTER — Ambulatory Visit: Payer: Self-pay | Attending: Nurse Practitioner | Admitting: Nurse Practitioner

## 2020-11-26 ENCOUNTER — Encounter: Payer: Self-pay | Admitting: Nurse Practitioner

## 2020-11-26 ENCOUNTER — Other Ambulatory Visit: Payer: Self-pay

## 2020-11-26 DIAGNOSIS — R7309 Other abnormal glucose: Secondary | ICD-10-CM

## 2020-11-26 DIAGNOSIS — D72829 Elevated white blood cell count, unspecified: Secondary | ICD-10-CM

## 2020-11-26 DIAGNOSIS — K921 Melena: Secondary | ICD-10-CM

## 2020-11-26 NOTE — Progress Notes (Signed)
Virtual Visit via Telephone Note Due to national recommendations of social distancing due to [place] 19, telehealth visit is felt to be most appropriate for this patient at this time.  I discussed the limitations, risks, security and privacy concerns of performing an evaluation and management service by telephone and the availability of in person appointments. I also discussed with the patient that there may be a patient responsible charge related to this service. The patient expressed understanding and agreed to proceed.    I connected with Tara Mathews on 11/26/20  at   3:30 PM EDT  EDT by telephone and verified that I am speaking with the correct person using two identifiers.  Location of Patient: Private Residence   Location of Provider: [place] and [place] participating in Telemedicine visit: Geryl Rankins FNP-BC Tara Mathews    History of Present Illness: Telemedicine visit for: Metallic taste in her mouth: Currently resolved but concerned she may have diabetes.  Now with GI concern. She has a past medical history of Anxiety and GERD.   GI Noticed streaks of blood in her stool. Also noticed blood on the tissue when she wiped. This does not occur every day. Also notes intermittent bloating, constipation and nausea (mostly when she wakes up). Denies abdominal pain or vomiting.      Past Medical History:  Diagnosis Date   Anxiety     Past Surgical History:  Procedure Laterality Date   LAPAROTOMY N/A 06/17/2020   Procedure: LAPAROTOMY;  Surgeon: Griffin Basil, MD;  Location: [facility];  Service: Gynecology;  Laterality: N/A;   NO PAST SURGERIES     OVARIAN CYST REMOVAL Left 06/17/2020   Procedure: OPEN OVARIAN CYSTECTOMY;  Surgeon: Griffin Basil, MD;  Location: [facility];  Service: Gynecology;  Laterality: Left;    Family History  Problem Relation Age of Onset   11 / Stillbirths Maternal Aunt    Cancer Maternal  Uncle        Brain tumor   Diabetes Paternal Grandmother    Obesity Mother    Arthritis Father    Hypertension Father    Obesity Father     Social History   Socioeconomic History   Marital status: Single    Spouse name: Not on file   Number of children: 1   Years of education: Not on file   Highest education level: Associate degree: occupational, Hotel manager, or vocational program  Occupational History   Occupation: UNEMPLOYED  Tobacco Use   Smoking status: Never   Smokeless tobacco: Never  Vaping Use   Vaping Use: Never used  Substance and Sexual Activity   Alcohol use: No   Drug use: No   Sexual activity: Yes    Birth control/protection: None    Comment: 1st intercourse 30 yo-Fewer than 5 partners  Other Topics Concern   Not on file  Social History Narrative   Not on file   Social Determinants of Health   Financial Resource Strain: Not on file  Food Insecurity: Not on file  Transportation Needs: No Transportation Needs   Lack of Transportation (Medical): No   Lack of Transportation (Non-Medical): No  Physical Activity: Not on file  Stress: Not on file  Social Connections: Not on file     Observations/Objective: Awake, alert and oriented x 3   Review of Systems  Constitutional:  Negative for fever, malaise/fatigue and weight loss.  HENT: Negative.  Negative for nosebleeds.   Eyes: Negative.  Negative for blurred vision, double vision and photophobia.  Respiratory: Negative.  Negative for cough and shortness of breath.   Cardiovascular: Negative.  Negative for chest pain, palpitations and leg swelling.  Gastrointestinal:  Positive for blood in stool, heartburn and nausea. Negative for abdominal pain, constipation, diarrhea, melena and vomiting.  Genitourinary: Negative.   Musculoskeletal: Negative.  Negative for myalgias.  Neurological: Negative.  Negative for dizziness, focal weakness, seizures and headaches.  Psychiatric/Behavioral:  Negative for suicidal  ideas. The patient is nervous/anxious.    Assessment and Plan: Diagnoses and all orders for this visit:  Blood in the stool -     Fecal occult blood, imunochemical([facility]); Future  Elevated glucose -     CMP14+EGFR; Future -     Hemoglobin A1c; Future  Leukocytosis, unspecified type -     CBC with Differential; Future    Follow Up Instructions Return if symptoms worsen or fail to improve.     I discussed the assessment and treatment plan with the patient. The patient was provided an opportunity to ask questions and all were answered. The patient agreed with the plan and demonstrated an understanding of the instructions.   The patient was advised to call back or seek an in-person evaluation if the symptoms worsen or if the condition fails to improve as anticipated.  I provided 10 minutes of non-face-to-face time during this encounter including median intraservice time, reviewing previous notes, labs, imaging, medications and explaining diagnosis and management.  Gildardo Pounds, FNP-BC

## 2020-11-28 ENCOUNTER — Other Ambulatory Visit: Payer: Self-pay

## 2020-11-28 ENCOUNTER — Ambulatory Visit: Payer: Self-pay | Attending: Nurse Practitioner

## 2020-11-28 DIAGNOSIS — D72829 Elevated white blood cell count, unspecified: Secondary | ICD-10-CM

## 2020-11-28 DIAGNOSIS — R7309 Other abnormal glucose: Secondary | ICD-10-CM

## 2020-11-29 LAB — CBC WITH DIFFERENTIAL/PLATELET
Basophils Absolute: 0.1 10*3/uL (ref 0.0–0.2)
Basos: 1 %
EOS (ABSOLUTE): 0.2 10*3/uL (ref 0.0–0.4)
Eos: 2 %
Hematocrit: 41.4 % (ref 34.0–46.6)
Hemoglobin: 13.5 g/dL (ref 11.1–15.9)
Immature Grans (Abs): 0 10*3/uL (ref 0.0–0.1)
Immature Granulocytes: 0 %
Lymphocytes Absolute: 2.8 10*3/uL (ref 0.7–3.1)
Lymphs: 36 %
MCH: 27.9 pg (ref 26.6–33.0)
MCHC: 32.6 g/dL (ref 31.5–35.7)
MCV: 86 fL (ref 79–97)
Monocytes Absolute: 0.6 10*3/uL (ref 0.1–0.9)
Monocytes: 7 %
Neutrophils Absolute: 4.1 10*3/uL (ref 1.4–7.0)
Neutrophils: 54 %
Platelets: 326 10*3/uL (ref 150–450)
RBC: 4.84 x10E6/uL (ref 3.77–5.28)
RDW: 12.7 % (ref 11.7–15.4)
WBC: 7.7 10*3/uL (ref 3.4–10.8)

## 2020-11-29 LAB — CMP14+EGFR
ALT: 60 IU/L — ABNORMAL HIGH (ref 0–32)
AST: 33 IU/L (ref 0–40)
Albumin/Globulin Ratio: 1.8 (ref 1.2–2.2)
Albumin: 4.6 g/dL (ref 3.9–5.0)
Alkaline Phosphatase: 94 IU/L (ref 44–121)
BUN/Creatinine Ratio: 16 (ref 9–23)
BUN: 9 mg/dL (ref 6–20)
Bilirubin Total: 0.6 mg/dL (ref 0.0–1.2)
CO2: 20 mmol/L (ref 20–29)
Calcium: 9.5 mg/dL (ref 8.7–10.2)
Chloride: 102 mmol/L (ref 96–106)
Creatinine, Ser: 0.55 mg/dL — ABNORMAL LOW (ref 0.57–1.00)
Globulin, Total: 2.5 g/dL (ref 1.5–4.5)
Glucose: 99 mg/dL (ref 65–99)
Potassium: 4.5 mmol/L (ref 3.5–5.2)
Sodium: 139 mmol/L (ref 134–144)
Total Protein: 7.1 g/dL (ref 6.0–8.5)
eGFR: 128 mL/min/{1.73_m2} (ref >59)

## 2020-11-29 LAB — HEMOGLOBIN A1C
Est. average glucose Bld gHb Est-mCnc: 114 mg/dL
Hgb A1c MFr Bld: 5.6 % (ref 4.8–5.6)

## 2020-12-01 NOTE — Addendum Note (Signed)
Addended bySteffanie Dunn on: 12/01/2020 04:30 PM   Modules accepted: Orders

## 2020-12-03 ENCOUNTER — Encounter: Payer: Self-pay | Admitting: Nurse Practitioner

## 2020-12-03 ENCOUNTER — Ambulatory Visit (INDEPENDENT_AMBULATORY_CARE_PROVIDER_SITE_OTHER): Payer: Self-pay | Admitting: Obstetrics

## 2020-12-03 ENCOUNTER — Encounter: Payer: Self-pay | Admitting: Obstetrics

## 2020-12-03 ENCOUNTER — Other Ambulatory Visit (HOSPITAL_COMMUNITY)
Admission: RE | Admit: 2020-12-03 | Discharge: 2020-12-03 | Disposition: A | Discharge status: Home / Self Care | Payer: Self-pay | Source: Ambulatory Visit | Attending: Obstetrics | Admitting: Obstetrics

## 2020-12-03 ENCOUNTER — Other Ambulatory Visit: Payer: Self-pay

## 2020-12-03 VITALS — BP 117/79 | HR 81 | Ht 60.0 in | Wt 217.0 lb

## 2020-12-03 DIAGNOSIS — Z01419 Encounter for gynecological examination (general) (routine) without abnormal findings: Secondary | ICD-10-CM

## 2020-12-03 DIAGNOSIS — N926 Irregular menstruation, unspecified: Secondary | ICD-10-CM

## 2020-12-03 DIAGNOSIS — R300 Dysuria: Secondary | ICD-10-CM

## 2020-12-03 DIAGNOSIS — N898 Other specified noninflammatory disorders of vagina: Secondary | ICD-10-CM

## 2020-12-03 LAB — POCT URINE PREGNANCY: Preg Test, Ur: NEGATIVE

## 2020-12-03 NOTE — Progress Notes (Signed)
States cycles have been irreg since surgery in March. Reports heavy bleeding up to 8 days with some cycles. Now has missed a period that was expected 11/23/20.  Negative UPT today.

## 2020-12-03 NOTE — Progress Notes (Signed)
Patient ID: Tara Mathews, female   DOB: 10/21/1990, 30 y.o.   MRN: [id number]  Chief Complaint  Patient presents with   Gynecologic Exam    HPI Tara Mathews is a 30 y.o. female.  History of ovarian cystectomy in March 2022.  Periods have been irregular since surgery.  Period is 10 days late this month.  Also c/o urinary frequency and burning. HPI  Past Medical History:  Diagnosis Date   Anxiety     Past Surgical History:  Procedure Laterality Date   LAPAROTOMY N/A 06/17/2020   Procedure: LAPAROTOMY;  Surgeon: Griffin Basil, MD;  Location: [place];  Service: Gynecology;  Laterality: N/A;   NO PAST SURGERIES     OVARIAN CYST REMOVAL Left 06/17/2020   Procedure: OPEN OVARIAN CYSTECTOMY;  Surgeon: Griffin Basil, MD;  Location: [place];  Service: Gynecology;  Laterality: Left;    Family History  Problem Relation Age of Onset   87 / Stillbirths Maternal Aunt    Cancer Maternal Uncle        Brain tumor   Diabetes Paternal Grandmother    Obesity Mother    Arthritis Father    Hypertension Father    Obesity Father     Social History Social History   Tobacco Use   Smoking status: Never   Smokeless tobacco: Never  Vaping Use   Vaping Use: Never used  Substance Use Topics   Alcohol use: No   Drug use: No    Allergies  Allergen Reactions   Bactrim [Sulfamethoxazole-Trimethoprim] Hives    Current Outpatient Medications  Medication Sig Dispense Refill   omeprazole (PRILOSEC) 40 MG capsule TAKE 1 CAPSULE (40 MG TOTAL) BY MOUTH DAILY. 90 capsule 1   No current facility-administered medications for this visit.    Review of Systems Review of Systems Constitutional: negative for fatigue and weight loss Respiratory: negative for cough and wheezing Cardiovascular: negative for chest pain, fatigue and palpitations Gastrointestinal: negative for abdominal pain and change in bowel habits Genitourinary: positive for vaginal discharge, dysuria and  irregular periods Integument/breast: negative for nipple discharge Musculoskeletal:negative for myalgias Neurological: negative for gait problems and tremors Behavioral/Psych: negative for abusive relationship, depression Endocrine: negative for temperature intolerance      Blood pressure 117/79, pulse 81, height 5' (1.524 m), weight 217 lb (98.4 kg), last menstrual period 10/23/2020, not currently breastfeeding.  Physical Exam Physical Exam General:   Alert and no distress  Skin:   no rash or abnormalities  Lungs:   clear to auscultation bilaterally  Heart:   regular rate and rhythm, S1, S2 normal, no murmur, click, rub or gallop  Breasts:   normal without suspicious masses, skin or nipple changes or axillary nodes  Abdomen:  normal findings: no organomegaly, soft, non-tender and no hernia  Pelvis:  External genitalia: normal general appearance Urinary system: urethral meatus normal and bladder without fullness, nontender Vaginal: normal without tenderness, induration or masses Cervix: normal appearance Adnexa: normal bimanual exam Uterus: anteverted and non-tender, normal size    I have spent a total of 20 minutes of face-to-face time, excluding clinical staff time, reviewing notes and preparing to see patient, ordering tests and/or medications, and counseling the patient.   Data Reviewed Wet Prep Urinalysis  Assessment     1. Encounter for routine gynecological examination with Papanicolaou smear of cervix Rx: - Cytology - PAP  2. Irregular periods Rx: - POCT urine pregnancy  3. Vaginal discharge Rx: - Cervicovaginal ancillary only( [place])  4. Dysuria Rx: - Urine Culture     Plan   Follow up in 3 months  Orders Placed This Encounter  Procedures   Urine Culture   POCT urine pregnancy     Shelly Bombard, MD 12/03/2020 11:27 AM

## 2020-12-04 ENCOUNTER — Other Ambulatory Visit: Payer: Self-pay | Admitting: Obstetrics

## 2020-12-04 ENCOUNTER — Other Ambulatory Visit: Payer: Self-pay

## 2020-12-04 DIAGNOSIS — B9689 Other specified bacterial agents as the cause of diseases classified elsewhere: Secondary | ICD-10-CM

## 2020-12-04 LAB — CERVICOVAGINAL ANCILLARY ONLY
Bacterial Vaginitis (gardnerella): POSITIVE — AB
Candida Glabrata: NEGATIVE
Candida Vaginitis: NEGATIVE
Chlamydia: NEGATIVE
Comment: NEGATIVE
Comment: NEGATIVE
Comment: NEGATIVE
Comment: NEGATIVE
Comment: NEGATIVE
Comment: NORMAL
Neisseria Gonorrhea: NEGATIVE
Trichomonas: NEGATIVE

## 2020-12-04 MED ORDER — METRONIDAZOLE 500 MG PO TABS
500.0000 mg | ORAL_TABLET | Freq: Two times a day (BID) | ORAL | 2 refills | Status: DC
Start: 2020-12-04 — End: 2021-03-30
  Filled 2020-12-04: qty 14, 7d supply, fill #0

## 2020-12-05 LAB — CYTOLOGY - PAP: Diagnosis: NEGATIVE

## 2020-12-05 LAB — URINE CULTURE

## 2020-12-05 LAB — FECAL OCCULT BLOOD, IMMUNOCHEMICAL: Fecal Occult Bld: NEGATIVE

## 2020-12-09 ENCOUNTER — Other Ambulatory Visit: Payer: Self-pay

## 2020-12-17 ENCOUNTER — Other Ambulatory Visit: Payer: Self-pay

## 2020-12-17 ENCOUNTER — Ambulatory Visit: Payer: Self-pay | Attending: Nurse Practitioner

## 2021-02-18 ENCOUNTER — Other Ambulatory Visit: Payer: Self-pay

## 2021-02-18 ENCOUNTER — Encounter: Payer: Self-pay | Admitting: Obstetrics

## 2021-02-18 ENCOUNTER — Ambulatory Visit (INDEPENDENT_AMBULATORY_CARE_PROVIDER_SITE_OTHER): Payer: Self-pay | Admitting: Obstetrics

## 2021-02-18 VITALS — BP 123/84 | HR 77 | Ht 60.0 in | Wt 223.0 lb

## 2021-02-18 DIAGNOSIS — N6452 Nipple discharge: Secondary | ICD-10-CM

## 2021-02-18 DIAGNOSIS — N644 Mastodynia: Secondary | ICD-10-CM

## 2021-02-18 NOTE — Progress Notes (Signed)
Patient ID: Tara Mathews, female   DOB: [birth year], 30 y.o.   MRN: [id number]  No chief complaint on file.   HPI Tara Mathews is a 30 y.o. female.  Complains of pain in left breast in the axillary tail.  Breast ultrasound done in December 2021 was negative.  She also c/o nipple discharge from left breast. HPI  Past Medical History:  Diagnosis Date   Anxiety     Past Surgical History:  Procedure Laterality Date   LAPAROTOMY N/A 06/17/2020   Procedure: LAPAROTOMY;  Surgeon: Griffin Basil, MD;  Location: [place];  Service: Gynecology;  Laterality: N/A;   NO PAST SURGERIES     OVARIAN CYST REMOVAL Left 06/17/2020   Procedure: OPEN OVARIAN CYSTECTOMY;  Surgeon: Griffin Basil, MD;  Location: [place];  Service: Gynecology;  Laterality: Left;    Family History  Problem Relation Age of Onset   56 / Stillbirths Maternal Aunt    Cancer Maternal Uncle        Brain tumor   Diabetes Paternal Grandmother    Obesity Mother    Arthritis Father    Hypertension Father    Obesity Father     Social History Social History   Tobacco Use   Smoking status: Never   Smokeless tobacco: Never  Vaping Use   Vaping Use: Never used  Substance Use Topics   Alcohol use: No   Drug use: No    Allergies  Allergen Reactions   Bactrim [Sulfamethoxazole-Trimethoprim] Hives    Current Outpatient Medications  Medication Sig Dispense Refill   metroNIDAZOLE (FLAGYL) 500 MG tablet Take 1 tablet (500 mg total) by mouth 2 (two) times daily. 14 tablet 2   omeprazole (PRILOSEC) 40 MG capsule TAKE 1 CAPSULE (40 MG TOTAL) BY MOUTH DAILY. (Patient not taking: Reported on 02/18/2021) 90 capsule 1   No current facility-administered medications for this visit.    Review of Systems Review of Systems Constitutional: negative for fatigue and weight loss Respiratory: negative for cough and wheezing Cardiovascular: negative for chest pain, fatigue and palpitations Gastrointestinal:  negative for abdominal pain and change in bowel habits Genitourinary:negative Integument/breast: positive for left breast pain and nipple discharge Musculoskeletal:negative for myalgias Neurological: negative for gait problems and tremors Behavioral/Psych: negative for abusive relationship, depression Endocrine: negative for temperature intolerance      Blood pressure 123/84, pulse 77, height 5' (1.524 m), weight 223 lb (101.2 kg), last menstrual period 01/13/2021, not currently breastfeeding.  Physical Exam Physical Exam General:   Alert and no distress  Skin:   no rash or abnormalities  Lungs:   clear to auscultation bilaterally  Heart:   regular rate and rhythm, S1, S2 normal, no murmur, click, rub or gallop  Breasts:   normal without suspicious masses, skin or nipple changes or axillary nodes    I have spent a total of 15 minutes of face-to-face time, excluding clinical staff time, reviewing notes and preparing to see patient, ordering tests and/or medications, and counseling the patient.   Data Reviewed Left Breast Ultrasound: US BREAST LTD UNI LEFT INC AXILLA (Accession [id number]) (Order [id number]) Imaging Date: 04/03/2020 Department: [facility] Imaging Released By: Heber Walden Authorizing: Constant, Peggy, MD   Exam Status  Status  Final [99]   PACS Intelerad Image Link   Show images for US BREAST LTD UNI LEFT INC AXILLA Study Result  Narrative & Impression  CLINICAL DATA:  30 year old female with a new lump and separate  area of focal pain in the left breast. Patient has recently stopped breast feeding.   EXAM: ULTRASOUND OF THE LEFT BREAST   COMPARISON:  Previous exam(s).   FINDINGS: On physical exam, I do not feel a discrete mass at the site of concern reported by the patient in the medial aspect of the right breast or an abnormality in the low axilla at the site of pain reported by the patient.   Targeted ultrasound is  performed in the left breast at 9 o'clock 12 cm from nipple at the palpable area of concern reported by the patient demonstrating no cystic or solid mass. No dilated duct or focal fluid collection.   Targeted ultrasound is performed in the left axilla at the site of focal pain reported by the patient demonstrating no cystic or solid mass or other abnormality to explain the patient's pain.   IMPRESSION: No sonographic evidence of malignancy or other abnormality at the palpable/painful sites of concern reported by the patient in the left breast.   RECOMMENDATION: 1. Recommend any further workup of the left palpable area and pain be on a clinical basis.   2.  Begin routine annual screening mammography at age 61.   I have discussed the findings and recommendations with the patient. If applicable, a reminder letter will be sent to the patient regarding the next appointment.   BI-RADS CATEGORY  1: Negative.     Electronically Signed   By: Audie Pinto M.D.   On: 04/03/2020 11:12       Assessment     1. Breast pain - negative breast ultrasound - will follow clinically  2. Nipple discharge - etiology probably from stimulation - encouraged to stop squeezing nipple     Plan   Follow up prn   Shelly Bombard, MD 02/18/2021 9:42 AM

## 2021-02-18 NOTE — Progress Notes (Signed)
Reports left breast pain shooting from left axilla to nipple, states milk comes out. Last breast fed 18 months ago. [place] last year normal. Mammogram not done. Continues to report irreg menses and neg UPT.  Maternal aunt with breast cancer.

## 2021-02-26 MED FILL — Omeprazole Cap Delayed Release 40 MG: ORAL | 90 days supply | Qty: 90 | Fill #0 | Status: AC

## 2021-02-27 ENCOUNTER — Other Ambulatory Visit: Payer: Self-pay

## 2021-03-02 ENCOUNTER — Other Ambulatory Visit: Payer: Self-pay

## 2021-03-02 ENCOUNTER — Telehealth: Payer: Self-pay | Admitting: Obstetrics

## 2021-03-04 ENCOUNTER — Ambulatory Visit: Payer: Self-pay | Admitting: Obstetrics

## 2021-03-10 ENCOUNTER — Encounter (HOSPITAL_BASED_OUTPATIENT_CLINIC_OR_DEPARTMENT_OTHER): Payer: Self-pay | Admitting: *Deleted

## 2021-03-10 ENCOUNTER — Other Ambulatory Visit: Payer: Self-pay

## 2021-03-10 ENCOUNTER — Emergency Department (HOSPITAL_BASED_OUTPATIENT_CLINIC_OR_DEPARTMENT_OTHER)
Admission: EM | Admit: 2021-03-10 | Discharge: 2021-03-10 | Disposition: A | Discharge status: Home / Self Care | Payer: Self-pay | Attending: Emergency Medicine | Admitting: Emergency Medicine

## 2021-03-10 ENCOUNTER — Emergency Department (HOSPITAL_BASED_OUTPATIENT_CLINIC_OR_DEPARTMENT_OTHER): Payer: Self-pay

## 2021-03-10 DIAGNOSIS — Z20822 Contact with and (suspected) exposure to covid-19: Secondary | ICD-10-CM | POA: Insufficient documentation

## 2021-03-10 DIAGNOSIS — K819 Cholecystitis, unspecified: Secondary | ICD-10-CM

## 2021-03-10 DIAGNOSIS — K219 Gastro-esophageal reflux disease without esophagitis: Secondary | ICD-10-CM | POA: Insufficient documentation

## 2021-03-10 DIAGNOSIS — R1011 Right upper quadrant pain: Secondary | ICD-10-CM

## 2021-03-10 DIAGNOSIS — K810 Acute cholecystitis: Secondary | ICD-10-CM | POA: Insufficient documentation

## 2021-03-10 LAB — CBC WITH DIFFERENTIAL/PLATELET
Abs Immature Granulocytes: 0.02 10*3/uL (ref 0.00–0.07)
Basophils Absolute: 0.1 10*3/uL (ref 0.0–0.1)
Basophils Relative: 1 %
Eosinophils Absolute: 0.1 10*3/uL (ref 0.0–0.5)
Eosinophils Relative: 1 %
HCT: 41.7 % (ref 36.0–46.0)
Hemoglobin: 13.7 g/dL (ref 12.0–15.0)
Immature Granulocytes: 0 %
Lymphocytes Relative: 29 %
Lymphs Abs: 3.1 10*3/uL (ref 0.7–4.0)
MCH: 28.4 pg (ref 26.0–34.0)
MCHC: 32.9 g/dL (ref 30.0–36.0)
MCV: 86.3 fL (ref 80.0–100.0)
Monocytes Absolute: 0.6 10*3/uL (ref 0.1–1.0)
Monocytes Relative: 6 %
Neutro Abs: 6.9 10*3/uL (ref 1.7–7.7)
Neutrophils Relative %: 63 %
Platelets: 388 10*3/uL (ref 150–400)
RBC: 4.83 MIL/uL (ref 3.87–5.11)
RDW: 12.5 % (ref 11.5–15.5)
WBC: 10.7 10*3/uL — ABNORMAL HIGH (ref 4.0–10.5)
nRBC: 0 % (ref 0.0–0.2)

## 2021-03-10 LAB — URINALYSIS, ROUTINE W REFLEX MICROSCOPIC
Bilirubin Urine: NEGATIVE
Glucose, UA: NEGATIVE mg/dL
Ketones, ur: NEGATIVE mg/dL
Leukocytes,Ua: NEGATIVE
Nitrite: NEGATIVE
Protein, ur: 30 mg/dL — AB
Specific Gravity, Urine: >1.03 — ABNORMAL HIGH (ref 1.005–1.030)
pH: 6.5 (ref 5.0–8.0)

## 2021-03-10 LAB — COMPREHENSIVE METABOLIC PANEL
ALT: 75 U/L — ABNORMAL HIGH (ref 0–44)
AST: 43 U/L — ABNORMAL HIGH (ref 15–41)
Albumin: 4.2 g/dL (ref 3.5–5.0)
Alkaline Phosphatase: 81 U/L (ref 38–126)
Anion gap: 9 (ref 5–15)
BUN: 15 mg/dL (ref 6–20)
CO2: 23 mmol/L (ref 22–32)
Calcium: 9.3 mg/dL (ref 8.9–10.3)
Chloride: 104 mmol/L (ref 98–111)
Creatinine, Ser: 0.54 mg/dL (ref 0.44–1.00)
GFR, Estimated: >60 mL/min (ref >60)
Glucose, Bld: 107 mg/dL — ABNORMAL HIGH (ref 70–99)
Potassium: 3.7 mmol/L (ref 3.5–5.1)
Sodium: 136 mmol/L (ref 135–145)
Total Bilirubin: 0.4 mg/dL (ref 0.3–1.2)
Total Protein: 8 g/dL (ref 6.5–8.1)

## 2021-03-10 LAB — LIPASE, BLOOD: Lipase: 36 U/L (ref 11–51)

## 2021-03-10 LAB — RESP PANEL BY RT-PCR (FLU A&B, COVID) ARPGX2
Influenza A by PCR: NEGATIVE
Influenza B by PCR: NEGATIVE
SARS Coronavirus 2 by RT PCR: NEGATIVE

## 2021-03-10 LAB — PREGNANCY, URINE: Preg Test, Ur: NEGATIVE

## 2021-03-10 LAB — URINALYSIS, MICROSCOPIC (REFLEX): WBC, UA: NONE SEEN WBC/hpf (ref 0–5)

## 2021-03-10 MED ORDER — ALUM & MAG HYDROXIDE-SIMETH 200-200-20 MG/5ML PO SUSP
30.0000 mL | Freq: Once | ORAL | Status: AC
  Administered 2021-03-10: 30 mL via ORAL

## 2021-03-10 MED ORDER — AMOXICILLIN-POT CLAVULANATE 875-125 MG PO TABS
1.0000 | ORAL_TABLET | Freq: Once | ORAL | Status: AC
  Administered 2021-03-10: 1 via ORAL

## 2021-03-10 MED ORDER — AMOXICILLIN-POT CLAVULANATE 875-125 MG PO TABS
1.0000 | ORAL_TABLET | Freq: Two times a day (BID) | ORAL | 0 refills | Status: DC
  Filled 2021-03-10: qty 14, 7d supply, fill #0

## 2021-03-10 MED ORDER — AMOXICILLIN-POT CLAVULANATE 875-125 MG PO TABS
ORAL_TABLET | ORAL | Status: AC
  Filled 2021-03-10: qty 1

## 2021-03-10 NOTE — ED Notes (Signed)
US at bedside now.

## 2021-03-10 NOTE — ED Provider Notes (Signed)
Accepted handoff at shift change from Theodis Blaze PA-C. Please see prior provider note for more detail.   Her HPI below:  She states that about 1 PM this afternoon she had a sudden onset of sharp epigastric abdominal pain and burning that she states lasted for about 2 hours.  She states that the pain also went to her back.  She states she had to have an episode of nausea and foamy, bilious vomiting.  Denies diarrhea or hematemesis.  She states that since the episode it is slowly abated.  She states that this does not feel like her usual GERD symptoms however she has had the same pain previously which she said self abated.  She states that the previous time the symptoms lasted longer however she did not seek medical attention at that time.  She does take omeprazole for her GERD and states that she has been compliant on this.  She denies chest pain, shortness of breath, fever or urinary symptoms.   Briefly: Patient is 30 y.o.   DDX: concern for cholecystitis, vs. Other.  Plan: Pending RUQ ultrasound.  Right upper quadrant ultrasound does show evidence of cholecystitis.  Spoke with Dr. Ninfa Linden with surgery who reports that given patient's minimal pain, comfort at this time he does not recommend acute admission for emergent surgery in the morning.  Spoke with patient who understands and agrees this plan.  Strict return precautions for any return of right upper quadrant pain so the patient to be admitted for emergent cholecystectomy.  Per Dr. Ninfa Linden we will begin patient on 1 week of Augmentin, patient given dose prior to discharge.  Discussed low-fat diet to help prevent symptoms of symptomatic gallbladder pain while patient is pending outpatient follow-up for nonemergent removal of gallbladder.  Patient discharged in stable condition at this time.     Anselmo Pickler, PA-C 03/11/21 0015    Gareth Morgan, MD 03/11/21 1227

## 2021-03-10 NOTE — Discharge Instructions (Addendum)
As we discussed your scan today showed suspicion for cholecystitis which is inflammation of your gallbladder.  The treatment for this condition is surgical removal.  However after speaking with the surgeon as you are stable today he wants you to call his office in the morning to schedule for outpatient follow-up on an urgent basis instead of being admitted to the hospital tonight.  As we discussed if you begin to have significant pain in the meantime you should return to the emergency department we will admit you for emergent surgery.  While you wait to follow-up with his group, I have prescribed you some antibiotics.  Please take the antibiotics as prescribed twice a day.  High-fat food, butter, oil may exacerbate your gallbladder pain in the meantime.  Try to stick to a bland diet, drink plenty of fluids.  It was a pleasure taking care of you today.

## 2021-03-10 NOTE — ED Provider Notes (Signed)
[facility] Provider Note   CSN: [id number] Arrival date & time: 03/10/21  1534     History Chief Complaint  Patient presents with   Abdominal Pain   Back Pain    Tara Mathews is a 30 y.o. female.  With past medical history of GERD who presents to the emergency department with abdominal pain.  She states that about 1 PM this afternoon she had a sudden onset of sharp epigastric abdominal pain and burning that she states lasted for about 2 hours.  She states that the pain also went to her back.  She states she had to have an episode of nausea and foamy, bilious vomiting.  Denies diarrhea or hematemesis.  She states that since the episode it is slowly abated.  She states that this does not feel like her usual GERD symptoms however she has had the same pain previously which she said self abated.  She states that the previous time the symptoms lasted longer however she did not seek medical attention at that time.  She does take omeprazole for her GERD and states that she has been compliant on this.  She denies chest pain, shortness of breath, fever or urinary symptoms.    Abdominal Pain Associated symptoms: nausea and vomiting   Associated symptoms: no chest pain, no diarrhea, no dysuria, no fever and no shortness of breath   Back Pain Associated symptoms: abdominal pain   Associated symptoms: no chest pain, no dysuria and no fever       Past Medical History:  Diagnosis Date   Anxiety     Patient Active Problem List   Diagnosis Date Noted   Postoperative visit 07/23/2020   Postoperative complication of skin involving drainage from surgical wound 07/02/2020   Encounter for post surgical wound check 06/24/2020   Dermoid cyst of ovary 06/17/2020   Dermoid cyst of ovary, left 03/12/2020   Breast mass in female 03/12/2020   Galactorrhea in female 03/12/2020   Shoulder dystocia during labor and delivery 05/08/2019   Labor and delivery,  indication for care 05/07/2019   Preeclampsia 05/07/2019   Positive GBS test 05/02/2019   Obesity affecting pregnancy, antepartum 10/18/2018   Encounter for supervision of normal pregnancy 10/17/2018    Past Surgical History:  Procedure Laterality Date   LAPAROTOMY N/A 06/17/2020   Procedure: LAPAROTOMY;  Surgeon: Griffin Basil, MD;  Location: [place];  Service: Gynecology;  Laterality: N/A;   NO PAST SURGERIES     OVARIAN CYST REMOVAL Left 06/17/2020   Procedure: OPEN OVARIAN CYSTECTOMY;  Surgeon: Griffin Basil, MD;  Location: [place];  Service: Gynecology;  Laterality: Left;     OB History     Gravida  1   Para  1   Term  1   Preterm      AB      Living  1      SAB      IAB      Ectopic      Multiple  0   Live Births  1           Family History  Problem Relation Age of Onset   45 / Stillbirths Maternal Aunt    Cancer Maternal Uncle        Brain tumor   Diabetes Paternal Grandmother    Obesity Mother    Arthritis Father    Hypertension Father    Obesity Father     Social History  Tobacco Use   Smoking status: Never   Smokeless tobacco: Never  Vaping Use   Vaping Use: Never used  Substance Use Topics   Alcohol use: No   Drug use: No    Home Medications Prior to Admission medications   Medication Sig Start Date End Date Taking? Authorizing Provider  omeprazole (PRILOSEC) 40 MG capsule TAKE 1 CAPSULE (40 MG TOTAL) BY MOUTH DAILY. 06/04/20 06/04/21 Yes Gildardo Pounds, NP  prednisoLONE acetate (PRED FORTE) 1 % ophthalmic suspension Place into the right eye. 10/15/20  Yes [provider]  metroNIDAZOLE (FLAGYL) 500 MG tablet Take 1 tablet (500 mg total) by mouth 2 (two) times daily. 12/04/20   Shelly Bombard, MD    Allergies    Bactrim [sulfamethoxazole-trimethoprim]  Review of Systems   Review of Systems  Constitutional:  Negative for fever.  Respiratory:  Negative for shortness of breath.   Cardiovascular:  Negative  for chest pain.  Gastrointestinal:  Positive for abdominal pain, nausea and vomiting. Negative for blood in stool and diarrhea.  Genitourinary:  Negative for dysuria.  Musculoskeletal:  Positive for back pain.  Neurological:  Negative for syncope and light-headedness.  All other systems reviewed and are negative.  Physical Exam Updated Vital Signs BP 131/74   Pulse 77   Temp 98.7 F (37.1 C) (Oral)   Resp 16   Ht 5' (1.524 m)   Wt 101.2 kg   LMP 02/20/2021   SpO2 100%   BMI 43.57 kg/m   Physical Exam Vitals and nursing note reviewed.  Constitutional:      General: She is not in acute distress.    Appearance: Normal appearance. She is well-developed. She is obese. She is not ill-appearing or toxic-appearing.  HENT:     Head: Normocephalic and atraumatic.     Mouth/Throat:     Mouth: Mucous membranes are moist.     Pharynx: Oropharynx is clear.  Eyes:     General: No scleral icterus.    Extraocular Movements: Extraocular movements intact.  Cardiovascular:     Rate and Rhythm: Normal rate and regular rhythm.     Pulses: Normal pulses.     Heart sounds: Normal heart sounds. No murmur heard. Pulmonary:     Effort: Pulmonary effort is normal. No respiratory distress.     Breath sounds: Normal breath sounds.  Abdominal:     General: Abdomen is protuberant. Bowel sounds are normal. There is no distension.     Palpations: Abdomen is soft.     Tenderness: There is abdominal tenderness in the right upper quadrant and epigastric area. There is no right CVA tenderness, left CVA tenderness, guarding or rebound. Positive signs include Murphy's sign. Negative signs include Rovsing's sign and McBurney's sign.  Musculoskeletal:        General: Normal range of motion.  Skin:    General: Skin is warm and dry.     Capillary Refill: Capillary refill takes less than 2 seconds.  Neurological:     General: No focal deficit present.     Mental Status: She is alert and oriented to person,  place, and time. Mental status is at baseline.  Psychiatric:        Mood and Affect: Mood normal.        Behavior: Behavior normal.    ED Results / Procedures / Treatments   Labs (all labs ordered are listed, but only abnormal results are displayed) Labs Reviewed  URINALYSIS, ROUTINE W REFLEX MICROSCOPIC - Abnormal; Notable  for the following components:      Result Value   APPearance CLOUDY (*)    Specific Gravity, Urine >1.030 (*)    Hgb urine dipstick SMALL (*)    Protein, ur 30 (*)    All other components within normal limits  URINALYSIS, MICROSCOPIC (REFLEX) - Abnormal; Notable for the following components:   Bacteria, UA FEW (*)    All other components within normal limits  CBC WITH DIFFERENTIAL/PLATELET - Abnormal; Notable for the following components:   WBC 10.7 (*)    All other components within normal limits  PREGNANCY, URINE  COMPREHENSIVE METABOLIC PANEL  LIPASE, BLOOD   EKG None  Radiology No results found.  Procedures Procedures   Medications Ordered in ED Medications  alum & mag hydroxide-simeth (MAALOX/MYLANTA) 200-200-20 MG/5ML suspension 30 mL (has no administration in time range)    ED Course  I have reviewed the triage vital signs and the nursing notes.  Pertinent labs & imaging results that were available during my care of the patient were reviewed by me and considered in my medical decision making (see chart for details).  Clinical Course as of 03/10/21 2004  Tue Mar 10, 2021  1955 Hx gerd, abdominal pain x one afternoon, some foamy vomiting, dif from gerd symptoms -- unremarkable labs, exams. Mild leuk, transamanitis. Getting RUQ [place]-- probably symptomatic cholelithiasis [CP]    Clinical Course User Index [CP] Prosperi, Jackalyn Lombard   MDM Rules/Calculators/A&P 30 year old female who presents to the emergency department with epigastric abdominal pain.  Abdominal exam is without peritoneal signs.  There is no evidence of an acute  abdomen.  She is well-appearing. Not pregnant UA without evidence of UTI, no CVA tenderness so doubt acute cystitis or pyelonephritis. She has no reported hematemesis or hematochezia so doubt GI bleed. Lipase 36, doubt acute pancreatitis CBC with mild leukocytosis to 10.7.  CMP demonstrates transaminitis with AST 43, ALT 75 which are elevated from previous. Right upper quadrant ultrasound pending She has no respiratory symptoms so doubt pneumonia Presentation is not consistent with appendicitis Presentation is not consistent with bowel obstruction or viscus perforation. Considered but doubt that this is ACS given age and risk factors.  She is not having any chest pain or palpitations.  Care of patient handed off to [place], PA-C at this time (20:00).  Work-up pending right upper quadrant ultrasound.  This is likely symptomatic cholelithiasis.  If right upper quadrant ultrasound demonstrates gallstones without cholecystitis, patient should be discharged with general surgery follow-up.  She does not have fever, jaundice, altered mental status that is concerning for cholangitis.  Patient may also benefit from adding Pepcid to her GERD regimen.  Final diagnosis and disposition pending complete work-up. Final Clinical Impression(s) / ED Diagnoses Final diagnoses:  None    Rx / DC Orders ED Discharge Orders     None        Mickie Hillier, PA-C 03/10/21 2006    Gareth Morgan, MD 03/11/21 1223

## 2021-03-10 NOTE — ED Triage Notes (Signed)
She woke with epigastric pain into her RUQ. Hx of gallstones last year. She never had surgery.

## 2021-03-11 ENCOUNTER — Other Ambulatory Visit: Payer: Self-pay

## 2021-03-12 ENCOUNTER — Other Ambulatory Visit: Payer: Self-pay

## 2021-03-17 ENCOUNTER — Ambulatory Visit: Payer: Self-pay | Attending: Nurse Practitioner | Admitting: Nurse Practitioner

## 2021-03-17 ENCOUNTER — Other Ambulatory Visit: Payer: Self-pay

## 2021-03-17 DIAGNOSIS — K819 Cholecystitis, unspecified: Secondary | ICD-10-CM

## 2021-03-17 DIAGNOSIS — Z09 Encounter for follow-up examination after completed treatment for conditions other than malignant neoplasm: Secondary | ICD-10-CM

## 2021-03-19 ENCOUNTER — Encounter: Payer: Self-pay | Admitting: Nurse Practitioner

## 2021-03-19 NOTE — Progress Notes (Signed)
Virtual Visit via Telephone Note Due to national recommendations of social distancing due to [place] 19, telehealth visit is felt to be most appropriate for this patient at this time.  I discussed the limitations, risks, security and privacy concerns of performing an evaluation and management service by telephone and the availability of in person appointments. I also discussed with the patient that there may be a patient responsible charge related to this service. The patient expressed understanding and agreed to proceed.    I connected with Tara Mathews on 03/19/21  at  10:30 AM EST  EDT by telephone and verified that I am speaking with the correct person using two identifiers.  Location of Patient: Private Residence   Location of Provider: [place] participating in Telemedicine visit: Geryl Rankins FNP-BC Tara Mathews    History of Present Illness Telemedicine visit for: Hospital Follow up  Recently (03-10-2021) evaluated in the ED with epigastric abdominal pain. RUQ US showing evidence of cholecystitis. She was treated with Agumentin and instructed to follow up with general surgery. Today she states her pain has significantly improved. She has been unable to schedule with [facility]. Will place referral today. Denies nausea,vomiting, constipation or diarrhea.       Past Medical History:  Diagnosis Date   Anxiety     Past Surgical History:  Procedure Laterality Date   LAPAROTOMY N/A 06/17/2020   Procedure: LAPAROTOMY;  Surgeon: Griffin Basil, MD;  Location: [place];  Service: Gynecology;  Laterality: N/A;   NO PAST SURGERIES     OVARIAN CYST REMOVAL Left 06/17/2020   Procedure: OPEN OVARIAN CYSTECTOMY;  Surgeon: Griffin Basil, MD;  Location: [place];  Service: Gynecology;  Laterality: Left;    Family History  Problem Relation Age of Onset   36 / Stillbirths Maternal Aunt    Cancer Maternal Uncle        Brain  tumor   Diabetes Paternal Grandmother    Obesity Mother    Arthritis Father    Hypertension Father    Obesity Father     Social History   Socioeconomic History   Marital status: Single    Spouse name: Not on file   Number of children: 1   Years of education: Not on file   Highest education level: Associate degree: occupational, Hotel manager, or vocational program  Occupational History   Occupation: UNEMPLOYED  Tobacco Use   Smoking status: Never   Smokeless tobacco: Never  Vaping Use   Vaping Use: Never used  Substance and Sexual Activity   Alcohol use: No   Drug use: No   Sexual activity: Yes    Birth control/protection: None    Comment: 1st intercourse 30 yo-Fewer than 5 partners  Other Topics Concern   Not on file  Social History Narrative   Not on file   Social Determinants of Health   Financial Resource Strain: Not on file  Food Insecurity: Not on file  Transportation Needs: No Transportation Needs   Lack of Transportation (Medical): No   Lack of Transportation (Non-Medical): No  Physical Activity: Not on file  Stress: Not on file  Social Connections: Not on file     Observations/Objective: Awake, alert and oriented x 3   Review of Systems  Constitutional:  Negative for fever, malaise/fatigue and weight loss.  HENT: Negative.  Negative for nosebleeds.   Eyes: Negative.  Negative for blurred vision, double vision and photophobia.  Respiratory: Negative.  Negative for cough and shortness of breath.   Cardiovascular: Negative.  Negative for chest pain, palpitations and leg swelling.  Gastrointestinal: Negative.  Negative for heartburn, nausea and vomiting.  Musculoskeletal: Negative.  Negative for myalgias.  Neurological: Negative.  Negative for dizziness, focal weakness, seizures and headaches.  Psychiatric/Behavioral: Negative.  Negative for suicidal ideas.    Assessment and Plan: Diagnoses and all orders for this visit:  Hospital discharge  follow-up  Cholecystitis -     Ambulatory referral to General Surgery    Follow Up Instructions Return if symptoms worsen or fail to improve.     I discussed the assessment and treatment plan with the patient. The patient was provided an opportunity to ask questions and all were answered. The patient agreed with the plan and demonstrated an understanding of the instructions.   The patient was advised to call back or seek an in-person evaluation if the symptoms worsen or if the condition fails to improve as anticipated.  I provided 10 minutes of non-face-to-face time during this encounter including median intraservice time, reviewing previous notes, labs, imaging, medications and explaining diagnosis and management.  Gildardo Pounds, FNP-BC

## 2021-03-30 ENCOUNTER — Ambulatory Visit (INDEPENDENT_AMBULATORY_CARE_PROVIDER_SITE_OTHER): Payer: Self-pay | Admitting: Surgery

## 2021-03-30 ENCOUNTER — Encounter: Payer: Self-pay | Admitting: Surgery

## 2021-03-30 ENCOUNTER — Other Ambulatory Visit: Payer: Self-pay

## 2021-03-30 ENCOUNTER — Telehealth: Payer: Self-pay | Admitting: Surgery

## 2021-03-30 VITALS — BP 123/84 | HR 65 | Temp 98.7°F | Ht 60.0 in | Wt 212.6 lb

## 2021-03-30 DIAGNOSIS — K811 Chronic cholecystitis: Secondary | ICD-10-CM

## 2021-03-30 NOTE — Telephone Encounter (Signed)
Outgoing call, left message for patient to call.  Please inform patient of the following information regarding scheduled surgery:    Pre-Admission date/time, COVID Testing date and Surgery date.  Surgery Date: 04/09/21 Preadmission Testing Date: 04/02/21 (phone 1p-5p) Covid Testing Date: Not needed.    Also patient will need to call at [phone], between 1-3:00pm the day before surgery, to find out what time to arrive for surgery.

## 2021-03-30 NOTE — Telephone Encounter (Signed)
Patient calls back, she is informed of all dates regarding her surgery and verbalized understanding. 

## 2021-03-30 NOTE — Patient Instructions (Addendum)
You have requested to have your gallbladder removed. This will be done at [facility] with Dr. Dahlia Byes.  You will most likely be out of work 1-2 weeks for this surgery. You will return after your post-op appointment with a lifting restriction for approximately 4 more weeks.  You will be able to eat anything you would like to following surgery. But, start by eating a bland diet and advance this as tolerated. The Gallbladder diet is below, please go as closely by this diet as possible prior to surgery to avoid any further attacks.  Please see the (blue)pre-care form that you have been given today. If you have any questions, please call our office.  Laparoscopic Cholecystectomy Laparoscopic cholecystectomy is surgery to remove the gallbladder. The gallbladder is located in the upper right part of the abdomen, behind the liver. It is a storage sac for bile, which is produced in the liver. Bile aids in the digestion and absorption of fats. Cholecystectomy is often done for inflammation of the gallbladder (cholecystitis). This condition is usually caused by a buildup of gallstones (cholelithiasis) in the gallbladder. Gallstones can block the flow of bile, and that can result in inflammation and pain. In severe cases, emergency surgery may be required. If emergency surgery is not required, you will have time to prepare for the procedure. Laparoscopic surgery is an alternative to open surgery. Laparoscopic surgery has a shorter recovery time. Your common bile duct may also need to be examined during the procedure. If stones are found in the common bile duct, they may be removed. LET [facility] CARE PROVIDER KNOW ABOUT: Any allergies you have. All medicines you are taking, including vitamins, herbs, eye drops, creams, and over-the-counter medicines. Previous problems you or members of your family have had with the use of anesthetics. Any blood disorders you have. Previous surgeries you have had.  Any  medical conditions you have. RISKS AND COMPLICATIONS Generally, this is a safe procedure. However, problems may occur, including: Infection. Bleeding. Allergic reactions to medicines. Damage to other structures or organs. A stone remaining in the common bile duct. A bile leak from the cyst duct that is clipped when your gallbladder is removed. The need to convert to open surgery, which requires a larger incision in the abdomen. This may be necessary if your surgeon thinks that it is not safe to continue with a laparoscopic procedure. BEFORE THE PROCEDURE Ask your health care provider about: Changing or stopping your regular medicines. This is especially important if you are taking diabetes medicines or blood thinners. Taking medicines such as aspirin and ibuprofen. These medicines can thin your blood. Do not take these medicines before your procedure if your health care provider instructs you not to. Follow instructions from your health care provider about eating or drinking restrictions. Let your health care provider know if you develop a cold or an infection before surgery. Plan to have someone take you home after the procedure. Ask your health care provider how your surgical site will be marked or identified. You may be given antibiotic medicine to help prevent infection. PROCEDURE To reduce your risk of infection: Your health care team will wash or sanitize their hands. Your skin will be washed with soap. An IV tube may be inserted into one of your veins. You will be given a medicine to make you fall asleep (general anesthetic). A breathing tube will be placed in your mouth. The surgeon will make several small cuts (incisions) in your abdomen. A thin, lighted tube (  laparoscope) that has a tiny camera on the end will be inserted through one of the small incisions. The camera on the laparoscope will send a picture to a TV screen (monitor) in the operating room. This will give the surgeon  a good view inside your abdomen. A gas will be pumped into your abdomen. This will expand your abdomen to give the surgeon more room to perform the surgery. Other tools that are needed for the procedure will be inserted through the other incisions. The gallbladder will be removed through one of the incisions. After your gallbladder has been removed, the incisions will be closed with stitches (sutures), staples, or skin glue. Your incisions may be covered with a bandage (dressing). The procedure may vary among health care providers and hospitals. AFTER THE PROCEDURE Your blood pressure, heart rate, breathing rate, and blood oxygen level will be monitored often until the medicines you were given have worn off. You will be given medicines as needed to control your pain.   This information is not intended to replace advice given to you by your health care provider. Make sure you discuss any questions you have with your health care provider.   Document Released: 03/29/2005 Document Revised: 12/18/2014 Document Reviewed: 11/08/2012 Elsevier Interactive Patient Education 2016 [place] Diet for Gallbladder Conditions A low-fat diet can be helpful if you have pancreatitis or a gallbladder condition. With these conditions, your pancreas and gallbladder have trouble digesting fats. A healthy eating plan with less fat will help rest your pancreas and gallbladder and reduce your symptoms. WHAT DO I NEED TO KNOW ABOUT THIS DIET? Eat a low-fat diet. Reduce your fat intake to less than 20-30% of your total daily calories. This is less than 50-60 g of fat per day. Remember that you need some fat in your diet. Ask your dietician what your daily goal should be. Choose nonfat and low-fat healthy foods. Look for the words "nonfat," "low fat," or "fat free." As a guide, look on the label and choose foods with less than 3 g of fat per serving. Eat only one serving. Avoid alcohol. Do not smoke. If  you need help quitting, talk with your health care provider. Eat small frequent meals instead of three large heavy meals. WHAT FOODS CAN I EAT? Grains Include healthy grains and starches such as potatoes, wheat bread, fiber-rich cereal, and brown rice. Choose whole grain options whenever possible. In adults, whole grains should account for 45-65% of your daily calories.  Fruits and Vegetables Eat plenty of fruits and vegetables. Fresh fruits and vegetables add fiber to your diet. Meats and Other Protein Sources Eat lean meat such as chicken and pork. Trim any fat off of meat before cooking it. Eggs, fish, and beans are other sources of protein. In adults, these foods should account for 10-35% of your daily calories. Dairy Choose low-fat milk and dairy options. Dairy includes fat and protein, as well as calcium.  Fats and Oils Limit high-fat foods such as fried foods, sweets, baked goods, sugary drinks.  Other Creamy sauces and condiments, such as mayonnaise, can add extra fat. Think about whether or not you need to use them, or use smaller amounts or low fat options. WHAT FOODS ARE NOT RECOMMENDED? High fat foods, such as: [employer]. Ice cream. [place] toast. Sweet rolls. Pizza. Cheese bread. Foods covered with batter, butter, creamy sauces, or cheese. Fried foods. Sugary drinks and desserts. Foods that cause gas or bloating   This information  is not intended to replace advice given to you by your health care provider. Make sure you discuss any questions you have with your health care provider.   Document Released: 04/03/2013 Document Reviewed: 04/03/2013 Elsevier Interactive Patient Education [employer].

## 2021-03-31 NOTE — Progress Notes (Signed)
Patient ID: Tara Mathews, female   DOB: 1991/01/27, 30 y.o.   MRN: [id number]  HPI Tara Mathews is a 30 y.o. female seen in consultation at the request of Mr. Beverly Gust .  Reports some intermittent right upper quadrant pain that is sharp and moderate intensity.  Currently the pain has been excessive by heavy meals.  She so far has been on a diet.  This has been present for almost a year but more recently went to the emergency room 2 weeks ago.  An ultrasound was obtained that have personally reviewed showing evidence of cholelithiasis and normal common bile duct.  She was managed with antibiotics and surgical follow-up. He did have a history of ovarian cystectomy for Left Dermoid  9 months ago. He has recovered well.  Recent CBC was normal slightly elevated white count of 10.7, CMP completely normal She Is able to perform more than 4 METS of activity without any shortness of breath or chest pain. She Continues to have some intermittent pains   HPI  Past Medical History:  Diagnosis Date   Anxiety     Past Surgical History:  Procedure Laterality Date   LAPAROTOMY N/A 06/17/2020   Procedure: LAPAROTOMY;  Surgeon: Griffin Basil, MD;  Location: [place];  Service: Gynecology;  Laterality: N/A;   NO PAST SURGERIES     OVARIAN CYST REMOVAL Left 06/17/2020   Procedure: OPEN OVARIAN CYSTECTOMY;  Surgeon: Griffin Basil, MD;  Location: [place];  Service: Gynecology;  Laterality: Left;    Family History  Problem Relation Age of Onset   55 / Stillbirths Maternal Aunt    Cancer Maternal Uncle        Brain tumor   Diabetes Paternal Grandmother    Obesity Mother    Arthritis Father    Hypertension Father    Obesity Father     Social History Social History   Tobacco Use   Smoking status: Never   Smokeless tobacco: Never  Vaping Use   Vaping Use: Never used  Substance Use Topics   Alcohol use: No   Drug use: No    Allergies  Allergen Reactions    Bactrim [Sulfamethoxazole-Trimethoprim] Hives    Current Outpatient Medications  Medication Sig Dispense Refill   omeprazole (PRILOSEC) 40 MG capsule TAKE 1 CAPSULE (40 MG TOTAL) BY MOUTH DAILY. 90 capsule 1   No current facility-administered medications for this visit.     Review of Systems Full ROS  was asked and was negative except for the information on the HPI  Physical Exam Blood pressure 123/84, pulse 65, temperature 98.7 F (37.1 C), temperature source Oral, height 5' (1.524 m), weight 212 lb 9.6 oz (96.4 kg), SpO2 99 %, not currently breastfeeding. CONSTITUTIONAL: NAD. EYES: Pupils are equal, round,  Sclera are non-icteric. EARS, NOSE, MOUTH AND THROAT: She is wearing a mask Hearing is intact to voice. LYMPH NODES:  Lymph nodes in the neck are normal. RESPIRATORY:  Lungs are clear. There is normal respiratory effort, with equal breath sounds bilaterally, and without pathologic use of accessory muscles. CARDIOVASCULAR: Heart is regular without murmurs, gallops, or rubs. GI: The abdomen is  soft, Tender to palpation RUQ, not quite Murphy sign. There are no palpable masses. There is no hepatosplenomegaly. There are normal bowel sounds  GU: Rectal deferred.   MUSCULOSKELETAL: Normal muscle strength and tone. No cyanosis or edema.   SKIN: Turgor is good and there are no pathologic skin lesions or ulcers. NEUROLOGIC: Motor and  sensation is grossly normal. Cranial nerves are grossly intact. PSYCH:  Oriented to person, place and time. Affect is normal.  Data Reviewed  I have personally reviewed the patient's imaging, laboratory findings and medical records.    Assessment/Plan 30 year old female with classic signs and symptoms consistent with now chronic cholecystitis.  DisCussed with the patient in detail about her disease process.  I definitely recommend cholecystectomy.  She might be having acute on chronic cholecystitis at this time.  She is currently not toxic.  She had some  hesitation about proceeding with cholecystectomy but I also had a strong feelings that she needs to have her gallbladder taken out.  I did express my concerns that this may progress and she may get worse.  He is okay we proceed with surgery next week.  I did offer to do it earlier and she politely declined. I discussed the procedure in detail.  The patient was given Neurosurgeon.  We discussed the risks and benefits of a laparoscopic cholecystectomy and possible cholangiogram including, but not limited to bleeding, infection, injury to surrounding structures such as the intestine or liver, bile leak, retained gallstones, need to convert to an open procedure, prolonged diarrhea, blood clots such as  DVT, common bile duct injury, anesthesia risks, and possible need for additional procedures.  The likelihood of improvement in symptoms and return to the patient's normal status is good. We discussed the typical post-operative recovery course.  Will be a candidate for robotic approach.  We will plan for robotic cholecystectomy next week. Please note that I spent 60 minutes's encounter including personally reviewing imaging studies, placing order, coordinating her care, counseling the patient and performing appropriate documentation Copy of this report was sent to the referring provider  Caroleen Hamman, MD FACS General Surgeon 03/31/2021, 8:00 AM

## 2021-03-31 NOTE — H&P (View-Only) (Signed)
Patient ID: Tara Mathews, female   DOB: 1991-03-06, 30 y.o.   MRN: [id number]  HPI Tara Mathews is a 30 y.o. female seen in consultation at the request of Mr. Tara Mathews .  Reports some intermittent right upper quadrant pain that is sharp and moderate intensity.  Currently the pain has been excessive by heavy meals.  She so far has been on a diet.  This has been present for almost a year but more recently went to the emergency room 2 weeks ago.  An ultrasound was obtained that have personally reviewed showing evidence of cholelithiasis and normal common bile duct.  She was managed with antibiotics and surgical follow-up. He did have a history of ovarian cystectomy for Left Dermoid  9 months ago. He has recovered well.  Recent CBC was normal slightly elevated white count of 10.7, CMP completely normal She Is able to perform more than 4 METS of activity without any shortness of breath or chest pain. She Continues to have some intermittent pains   HPI  Past Medical History:  Diagnosis Date   Anxiety     Past Surgical History:  Procedure Laterality Date   LAPAROTOMY N/A 06/17/2020   Procedure: LAPAROTOMY;  Surgeon: Griffin Basil, MD;  Location: [place];  Service: Gynecology;  Laterality: N/A;   NO PAST SURGERIES     OVARIAN CYST REMOVAL Left 06/17/2020   Procedure: OPEN OVARIAN CYSTECTOMY;  Surgeon: Griffin Basil, MD;  Location: [place];  Service: Gynecology;  Laterality: Left;    Family History  Problem Relation Age of Onset   22 / Stillbirths Maternal Aunt    Cancer Maternal Uncle        Brain tumor   Diabetes Paternal Grandmother    Obesity Mother    Arthritis Father    Hypertension Father    Obesity Father     Social History Social History   Tobacco Use   Smoking status: Never   Smokeless tobacco: Never  Vaping Use   Vaping Use: Never used  Substance Use Topics   Alcohol use: No   Drug use: No    Allergies  Allergen Reactions    Bactrim [Sulfamethoxazole-Trimethoprim] Hives    Current Outpatient Medications  Medication Sig Dispense Refill   omeprazole (PRILOSEC) 40 MG capsule TAKE 1 CAPSULE (40 MG TOTAL) BY MOUTH DAILY. 90 capsule 1   No current facility-administered medications for this visit.     Review of Systems Full ROS  was asked and was negative except for the information on the HPI  Physical Exam Blood pressure 123/84, pulse 65, temperature 98.7 F (37.1 C), temperature source Oral, height 5' (1.524 m), weight 212 lb 9.6 oz (96.4 kg), SpO2 99 %, not currently breastfeeding. CONSTITUTIONAL: NAD. EYES: Pupils are equal, round,  Sclera are non-icteric. EARS, NOSE, MOUTH AND THROAT: She is wearing a mask Hearing is intact to voice. LYMPH NODES:  Lymph nodes in the neck are normal. RESPIRATORY:  Lungs are clear. There is normal respiratory effort, with equal breath sounds bilaterally, and without pathologic use of accessory muscles. CARDIOVASCULAR: Heart is regular without murmurs, gallops, or rubs. GI: The abdomen is  soft, Tender to palpation RUQ, not quite Murphy sign. There are no palpable masses. There is no hepatosplenomegaly. There are normal bowel sounds  GU: Rectal deferred.   MUSCULOSKELETAL: Normal muscle strength and tone. No cyanosis or edema.   SKIN: Turgor is good and there are no pathologic skin lesions or ulcers. NEUROLOGIC: Motor and  sensation is grossly normal. Cranial nerves are grossly intact. PSYCH:  Oriented to person, place and time. Affect is normal.  Data Reviewed  I have personally reviewed the patient's imaging, laboratory findings and medical records.    Assessment/Plan 30 year old female with classic signs and symptoms consistent with now chronic cholecystitis.  DisCussed with the patient in detail about her disease process.  I definitely recommend cholecystectomy.  She might be having acute on chronic cholecystitis at this time.  She is currently not toxic.  She had some  hesitation about proceeding with cholecystectomy but I also had a strong feelings that she needs to have her gallbladder taken out.  I did express my concerns that this may progress and she may get worse.  He is okay we proceed with surgery next week.  I did offer to do it earlier and she politely declined. I discussed the procedure in detail.  The patient was given Neurosurgeon.  We discussed the risks and benefits of a laparoscopic cholecystectomy and possible cholangiogram including, but not limited to bleeding, infection, injury to surrounding structures such as the intestine or liver, bile leak, retained gallstones, need to convert to an open procedure, prolonged diarrhea, blood clots such as  DVT, common bile duct injury, anesthesia risks, and possible need for additional procedures.  The likelihood of improvement in symptoms and return to the patient's normal status is good. We discussed the typical post-operative recovery course.  Will be a candidate for robotic approach.  We will plan for robotic cholecystectomy next week. Please note that I spent 60 minutes's encounter including personally reviewing imaging studies, placing order, coordinating her care, counseling the patient and performing appropriate documentation Copy of this report was sent to the referring provider  Caroleen Hamman, MD FACS General Surgeon 03/31/2021, 8:00 AM

## 2021-04-02 ENCOUNTER — Encounter
Admission: RE | Admit: 2021-04-02 | Discharge: 2021-04-02 | Disposition: A | Discharge status: Home / Self Care | Payer: Self-pay | Source: Ambulatory Visit | Attending: Surgery | Admitting: Surgery

## 2021-04-02 ENCOUNTER — Other Ambulatory Visit: Payer: Self-pay

## 2021-04-02 ENCOUNTER — Encounter: Payer: Self-pay | Admitting: Surgery

## 2021-04-02 HISTORY — DX: Gastro-esophageal reflux disease without esophagitis: K21.9

## 2021-04-02 NOTE — Patient Instructions (Signed)
Your procedure is scheduled on:04-09-21 Thursday Report to the Registration Desk on the 1st floor of the [facility].Then proceed to the 2nd floor Surgery Desk in the Medical mall To find out your arrival time, please call [phone] between 1PM - 3PM on:04-08-21 Wednesday  REMEMBER: Instructions that are not followed completely may result in serious medical risk, up to and including death; or upon the discretion of your surgeon and anesthesiologist your surgery may need to be rescheduled.  Do not eat food after midnight the night before surgery.  No gum chewing, lozengers or hard candies.  You may however, drink CLEAR liquids up to 2 hours before you are scheduled to arrive for your surgery. Do not drink anything within 2 hours of your scheduled arrival time.  Clear liquids include: - water  - apple juice without pulp - gatorade (not RED, PURPLE, OR BLUE) - black coffee or tea (Do NOT add milk or creamers to the coffee or tea) Do NOT drink anything that is not on this list.  TAKE THESE MEDICATIONS THE MORNING OF SURGERY WITH A SIP OF WATER: -omeprazole (PRILOSEC) -take one the night before and one on the morning of surgery - helps to prevent nausea after surgery.)  One week prior to surgery: Stop Anti-inflammatories (NSAIDS) such as Advil, Aleve, Ibuprofen, Motrin, Naproxen, Naprosyn and Aspirin based products such as Excedrin, Goodys Powder, BC Powder.You may however, take Tylenol if needed for pain up until the day of surgery.  Stop ANY OVER THE COUNTER supplements/vitamins NOW (04-02-21) until after surgery (ELDERBERRY )  No Alcohol for 24 hours before or after surgery.  No Smoking including e-cigarettes for 24 hours prior to surgery.  No chewable tobacco products for at least 6 hours prior to surgery.  No nicotine patches on the day of surgery.  Do not use any "recreational" drugs for at least a week prior to your surgery.  Please be advised that the combination of cocaine  and anesthesia may have negative outcomes, up to and including death. If you test positive for cocaine, your surgery will be cancelled.  On the morning of surgery brush your teeth with toothpaste and water, you may rinse your mouth with mouthwash if you wish. Do not swallow any toothpaste or mouthwash.  Use CHG Soap as directed on instruction sheet.  Do not wear jewelry, make-up, hairpins, clips or nail polish.  Do not wear lotions, powders, or perfumes.   Do not shave body from the neck down 48 hours prior to surgery just in case you cut yourself which could leave a site for infection.  Also, freshly shaved skin may become irritated if using the CHG soap.  Contact lenses, hearing aids and dentures may not be worn into surgery.  Do not bring valuables to the hospital. [facility] is not responsible for any missing/lost belongings or valuables.   Notify your doctor if there is any change in your medical condition (cold, fever, infection).  Wear comfortable clothing (specific to your surgery type) to the hospital.  After surgery, you can help prevent lung complications by doing breathing exercises.  Take deep breaths and cough every 1-2 hours. Your doctor may order a device called an Incentive Spirometer to help you take deep breaths. When coughing or sneezing, hold a pillow firmly against your incision with both hands. This is called splinting. Doing this helps protect your incision. It also decreases belly discomfort.  If you are being admitted to the hospital overnight, leave your suitcase in the  car. After surgery it may be brought to your room.  If you are being discharged the day of surgery, you will not be allowed to drive home. You will need a responsible adult (18 years or older) to drive you home and stay with you that night.   If you are taking public transportation, you will need to have a responsible adult (18 years or older) with you. Please confirm with your physician  that it is acceptable to use public transportation.   Please call the [facility] Dept. at [phone] if you have any questions about these instructions.  Surgery Visitation Policy:  Patients undergoing a surgery or procedure may have one family member or support person with them as long as that person is not COVID-19 positive or experiencing its symptoms.  That person may remain in the waiting area during the procedure and may rotate out with other people.  Inpatient Visitation:    Visiting hours are 7 a.m. to 8 p.m. Up to two visitors ages 16+ are allowed at one time in a patient room. The visitors may rotate out with other people during the day. Visitors must check out when they leave, or other visitors will not be allowed. One designated support person may remain overnight. The visitor must pass COVID-19 screenings, use hand sanitizer when entering and exiting the patients room and wear a mask at all times, including in the patients room. Patients must also wear a mask when staff or their visitor are in the room. Masking is required regardless of vaccination status.

## 2021-04-08 MED ORDER — LIDOCAINE HCL (PF) 2 % IJ SOLN
INTRAMUSCULAR | Status: AC
  Filled 2021-04-08: qty 5

## 2021-04-08 MED ORDER — PROPOFOL 500 MG/50ML IV EMUL
INTRAVENOUS | Status: AC
  Filled 2021-04-08: qty 50

## 2021-04-08 MED ORDER — PHENYLEPHRINE HCL (PRESSORS) 10 MG/ML IV SOLN
INTRAVENOUS | Status: AC
  Filled 2021-04-08: qty 1

## 2021-04-08 MED ORDER — PROPOFOL 500 MG/50ML IV EMUL
INTRAVENOUS | Status: AC
  Filled 2021-04-08: qty 100

## 2021-04-09 ENCOUNTER — Encounter: Payer: Self-pay | Admitting: Surgery

## 2021-04-09 ENCOUNTER — Encounter
Admission: RE | Disposition: A | Discharge status: Home / Self Care | Payer: Self-pay | Source: Home / Self Care | Attending: Surgery

## 2021-04-09 ENCOUNTER — Other Ambulatory Visit: Payer: Self-pay

## 2021-04-09 ENCOUNTER — Ambulatory Visit
Admission: RE | Admit: 2021-04-09 | Discharge: 2021-04-09 | Disposition: A | Discharge status: Home / Self Care | Payer: Self-pay | Attending: Surgery | Admitting: Surgery

## 2021-04-09 ENCOUNTER — Ambulatory Visit: Payer: Self-pay | Admitting: Registered Nurse

## 2021-04-09 DIAGNOSIS — Z8742 Personal history of other diseases of the female genital tract: Secondary | ICD-10-CM | POA: Insufficient documentation

## 2021-04-09 DIAGNOSIS — Z6841 Body Mass Index (BMI) 40.0 and over, adult: Secondary | ICD-10-CM | POA: Insufficient documentation

## 2021-04-09 DIAGNOSIS — K219 Gastro-esophageal reflux disease without esophagitis: Secondary | ICD-10-CM | POA: Insufficient documentation

## 2021-04-09 DIAGNOSIS — K811 Chronic cholecystitis: Secondary | ICD-10-CM

## 2021-04-09 DIAGNOSIS — K8010 Calculus of gallbladder with chronic cholecystitis without obstruction: Secondary | ICD-10-CM | POA: Insufficient documentation

## 2021-04-09 LAB — POCT PREGNANCY, URINE: Preg Test, Ur: NEGATIVE

## 2021-04-09 SURGERY — CHOLECYSTECTOMY, ROBOT-ASSISTED, LAPAROSCOPIC
Anesthesia: General

## 2021-04-09 MED ORDER — HYDROMORPHONE HCL 1 MG/ML IJ SOLN
INTRAMUSCULAR | Status: AC
  Filled 2021-04-09: qty 1

## 2021-04-09 MED ORDER — SUGAMMADEX SODIUM 200 MG/2ML IV SOLN
INTRAVENOUS | Status: DC | PRN
  Administered 2021-04-09: 100 mg via INTRAVENOUS
  Administered 2021-04-09: 400 mg via INTRAVENOUS

## 2021-04-09 MED ORDER — ROCURONIUM BROMIDE 10 MG/ML (PF) SYRINGE
PREFILLED_SYRINGE | INTRAVENOUS | Status: AC
  Filled 2021-04-09: qty 10

## 2021-04-09 MED ORDER — PROPOFOL 10 MG/ML IV BOLUS
INTRAVENOUS | Status: AC
  Filled 2021-04-09: qty 20

## 2021-04-09 MED ORDER — ROCURONIUM BROMIDE 100 MG/10ML IV SOLN
INTRAVENOUS | Status: DC | PRN
  Administered 2021-04-09: 50 mg via INTRAVENOUS
  Administered 2021-04-09: 20 mg via INTRAVENOUS

## 2021-04-09 MED ORDER — LIDOCAINE HCL (CARDIAC) PF 100 MG/5ML IV SOSY
PREFILLED_SYRINGE | INTRAVENOUS | Status: DC | PRN
  Administered 2021-04-09: 50 mg via INTRAVENOUS

## 2021-04-09 MED ORDER — CHLORHEXIDINE GLUCONATE 0.12 % MT SOLN
OROMUCOSAL | Status: AC
  Filled 2021-04-09: qty 15

## 2021-04-09 MED ORDER — BUPIVACAINE LIPOSOME 1.3 % IJ SUSP
INTRAMUSCULAR | Status: AC
  Filled 2021-04-09: qty 20

## 2021-04-09 MED ORDER — FENTANYL CITRATE (PF) 100 MCG/2ML IJ SOLN
INTRAMUSCULAR | Status: AC
  Filled 2021-04-09: qty 2

## 2021-04-09 MED ORDER — LIDOCAINE HCL (PF) 2 % IJ SOLN
INTRAMUSCULAR | Status: AC
  Filled 2021-04-09: qty 5

## 2021-04-09 MED ORDER — HYDROCODONE-ACETAMINOPHEN 5-325 MG PO TABS: 1.0000 | ORAL_TABLET | Freq: Four times a day (QID) | ORAL | 0 refills | Status: DC | PRN

## 2021-04-09 MED ORDER — FENTANYL CITRATE (PF) 100 MCG/2ML IJ SOLN
25.0000 ug | INTRAMUSCULAR | Status: DC | PRN
  Administered 2021-04-09 (×2): 50 ug via INTRAVENOUS

## 2021-04-09 MED ORDER — BUPIVACAINE-EPINEPHRINE 0.25% -1:200000 IJ SOLN
INTRAMUSCULAR | Status: DC | PRN
  Administered 2021-04-09: 50 mL

## 2021-04-09 MED ORDER — 0.9 % SODIUM CHLORIDE (POUR BTL) OPTIME
TOPICAL | Status: DC | PRN
  Administered 2021-04-09: 12:00:00 100 mL

## 2021-04-09 MED ORDER — LACTATED RINGERS IV SOLN: INTRAVENOUS | Status: DC

## 2021-04-09 MED ORDER — CHLORHEXIDINE GLUCONATE CLOTH 2 % EX PADS: 6.0000 | MEDICATED_PAD | Freq: Once | CUTANEOUS | Status: DC

## 2021-04-09 MED ORDER — ACETAMINOPHEN 500 MG PO TABS
1000.0000 mg | ORAL_TABLET | ORAL | Status: AC
  Administered 2021-04-09: 10:00:00 1000 mg via ORAL

## 2021-04-09 MED ORDER — FENTANYL CITRATE (PF) 100 MCG/2ML IJ SOLN
INTRAMUSCULAR | Status: AC
  Administered 2021-04-09: 13:00:00 50 ug via INTRAVENOUS
  Filled 2021-04-09: qty 2

## 2021-04-09 MED ORDER — ONDANSETRON HCL 4 MG/2ML IJ SOLN
INTRAMUSCULAR | Status: AC
  Filled 2021-04-09: qty 2

## 2021-04-09 MED ORDER — MIDAZOLAM HCL 2 MG/2ML IJ SOLN
INTRAMUSCULAR | Status: DC | PRN
  Administered 2021-04-09: 2 mg via INTRAVENOUS

## 2021-04-09 MED ORDER — OXYCODONE HCL 5 MG PO TABS
5.0000 mg | ORAL_TABLET | ORAL | Status: AC
  Administered 2021-04-09: 14:00:00 5 mg via ORAL

## 2021-04-09 MED ORDER — OXYCODONE HCL 5 MG PO TABS
ORAL_TABLET | ORAL | Status: AC
  Filled 2021-04-09: qty 1

## 2021-04-09 MED ORDER — DEXAMETHASONE SODIUM PHOSPHATE 10 MG/ML IJ SOLN
INTRAMUSCULAR | Status: DC | PRN
  Administered 2021-04-09: 10 mg via INTRAVENOUS

## 2021-04-09 MED ORDER — HYDROMORPHONE HCL 1 MG/ML IJ SOLN
0.5000 mg | INTRAMUSCULAR | Status: DC | PRN
  Administered 2021-04-09: 13:00:00 0.5 mg via INTRAVENOUS

## 2021-04-09 MED ORDER — ORAL CARE MOUTH RINSE: 15.0000 mL | Freq: Once | OROMUCOSAL | Status: AC

## 2021-04-09 MED ORDER — GABAPENTIN 300 MG PO CAPS
ORAL_CAPSULE | ORAL | Status: AC
  Filled 2021-04-09: qty 1

## 2021-04-09 MED ORDER — CEFAZOLIN SODIUM-DEXTROSE 2-4 GM/100ML-% IV SOLN
2.0000 g | INTRAVENOUS | Status: AC
  Administered 2021-04-09: 11:00:00 2 g via INTRAVENOUS

## 2021-04-09 MED ORDER — FENTANYL CITRATE (PF) 100 MCG/2ML IJ SOLN
INTRAMUSCULAR | Status: DC | PRN
  Administered 2021-04-09 (×3): 50 ug via INTRAVENOUS

## 2021-04-09 MED ORDER — CELECOXIB 200 MG PO CAPS
200.0000 mg | ORAL_CAPSULE | ORAL | Status: AC
  Administered 2021-04-09: 10:00:00 200 mg via ORAL

## 2021-04-09 MED ORDER — CHLORHEXIDINE GLUCONATE 0.12 % MT SOLN
15.0000 mL | Freq: Once | OROMUCOSAL | Status: AC
  Administered 2021-04-09: 10:00:00 15 mL via OROMUCOSAL

## 2021-04-09 MED ORDER — MEPERIDINE HCL 25 MG/ML IJ SOLN: 6.2500 mg | INTRAMUSCULAR | Status: DC | PRN

## 2021-04-09 MED ORDER — BUPIVACAINE-EPINEPHRINE (PF) 0.25% -1:200000 IJ SOLN
INTRAMUSCULAR | Status: AC
  Filled 2021-04-09: qty 30

## 2021-04-09 MED ORDER — KETAMINE HCL 50 MG/5ML IJ SOSY
PREFILLED_SYRINGE | INTRAMUSCULAR | Status: AC
  Filled 2021-04-09: qty 5

## 2021-04-09 MED ORDER — SUGAMMADEX SODIUM 500 MG/5ML IV SOLN
INTRAVENOUS | Status: AC
  Filled 2021-04-09: qty 5

## 2021-04-09 MED ORDER — DEXAMETHASONE SODIUM PHOSPHATE 10 MG/ML IJ SOLN
INTRAMUSCULAR | Status: AC
  Filled 2021-04-09: qty 1

## 2021-04-09 MED ORDER — CELECOXIB 200 MG PO CAPS
ORAL_CAPSULE | ORAL | Status: AC
  Filled 2021-04-09: qty 1

## 2021-04-09 MED ORDER — PROPOFOL 10 MG/ML IV BOLUS
INTRAVENOUS | Status: DC | PRN
  Administered 2021-04-09: 160 mg via INTRAVENOUS

## 2021-04-09 MED ORDER — DEXMEDETOMIDINE HCL IN NACL 200 MCG/50ML IV SOLN
INTRAVENOUS | Status: DC | PRN
  Administered 2021-04-09 (×4): 10 ug via INTRAVENOUS

## 2021-04-09 MED ORDER — INDOCYANINE GREEN 25 MG IV SOLR
5.0000 mg | Freq: Once | INTRAVENOUS | Status: AC
  Administered 2021-04-09: 10:00:00 5 mg via INTRAVENOUS
  Filled 2021-04-09: qty 2

## 2021-04-09 MED ORDER — MIDAZOLAM HCL 2 MG/2ML IJ SOLN
INTRAMUSCULAR | Status: AC
  Filled 2021-04-09: qty 2

## 2021-04-09 MED ORDER — ONDANSETRON HCL 4 MG/2ML IJ SOLN
INTRAMUSCULAR | Status: DC | PRN
  Administered 2021-04-09: 4 mg via INTRAVENOUS

## 2021-04-09 MED ORDER — GABAPENTIN 300 MG PO CAPS
300.0000 mg | ORAL_CAPSULE | ORAL | Status: AC
  Administered 2021-04-09: 10:00:00 300 mg via ORAL

## 2021-04-09 MED ORDER — CEFAZOLIN SODIUM-DEXTROSE 2-4 GM/100ML-% IV SOLN
INTRAVENOUS | Status: AC
  Filled 2021-04-09: qty 100

## 2021-04-09 MED ORDER — ACETAMINOPHEN 500 MG PO TABS
ORAL_TABLET | ORAL | Status: AC
  Filled 2021-04-09: qty 2

## 2021-04-09 MED ORDER — ONDANSETRON HCL 4 MG/2ML IJ SOLN: 4.0000 mg | Freq: Once | INTRAMUSCULAR | Status: DC | PRN

## 2021-04-09 MED ORDER — KETAMINE HCL 10 MG/ML IJ SOLN
INTRAMUSCULAR | Status: DC | PRN
  Administered 2021-04-09: 10 mg via INTRAVENOUS
  Administered 2021-04-09: 40 mg via INTRAVENOUS

## 2021-04-09 MED ORDER — SODIUM CHLORIDE 0.9 % IR SOLN
Status: DC | PRN
  Administered 2021-04-09: 500 mL

## 2021-04-09 SURGICAL SUPPLY — 50 items
ADH SKN CLS APL DERMABOND .7 (GAUZE/BANDAGES/DRESSINGS) ×1
BAG SPEC RTRVL LRG 6X4 10 (ENDOMECHANICALS) ×1
CANNULA REDUC XI 12-8 STAPL (CANNULA) ×1
CANNULA REDUC XI 12-8MM STAPL (CANNULA) ×1
CANNULA REDUCER 12-8 DVNC XI (CANNULA) ×1 IMPLANT
CLIP LIGATING HEMO O LOK GREEN (MISCELLANEOUS) ×3 IMPLANT
DECANTER SPIKE VIAL GLASS SM (MISCELLANEOUS) ×3 IMPLANT
DERMABOND ADVANCED (GAUZE/BANDAGES/DRESSINGS) ×2
DERMABOND ADVANCED .7 DNX12 (GAUZE/BANDAGES/DRESSINGS) ×1 IMPLANT
DRAPE ARM DVNC X/XI (DISPOSABLE) ×4 IMPLANT
DRAPE COLUMN DVNC XI (DISPOSABLE) ×1 IMPLANT
DRAPE DA VINCI XI ARM (DISPOSABLE) ×8
DRAPE DA VINCI XI COLUMN (DISPOSABLE) ×2
ELECT CAUTERY BLADE 6.4 (BLADE) ×3 IMPLANT
ELECT REM PT RETURN 9FT ADLT (ELECTROSURGICAL) ×3
ELECTRODE REM PT RTRN 9FT ADLT (ELECTROSURGICAL) ×1 IMPLANT
GAUZE 4X4 16PLY ~~LOC~~+RFID DBL (SPONGE) ×3 IMPLANT
GLOVE SURG ENC MOIS LTX SZ7 (GLOVE) ×6 IMPLANT
GOWN STRL REUS W/ TWL LRG LVL3 (GOWN DISPOSABLE) ×4 IMPLANT
GOWN STRL REUS W/TWL LRG LVL3 (GOWN DISPOSABLE) ×12
IRRIGATION STRYKERFLOW (MISCELLANEOUS) IMPLANT
IRRIGATOR STRYKERFLOW (MISCELLANEOUS) ×3
IV NS 1000ML (IV SOLUTION) ×3
IV NS 1000ML BAXH (IV SOLUTION) IMPLANT
KIT PINK PAD W/HEAD ARE REST (MISCELLANEOUS) ×3
KIT PINK PAD W/HEAD ARM REST (MISCELLANEOUS) ×1 IMPLANT
LABEL OR SOLS (LABEL) ×3 IMPLANT
MANIFOLD NEPTUNE II (INSTRUMENTS) ×3 IMPLANT
NEEDLE HYPO 22GX1.5 SAFETY (NEEDLE) ×3 IMPLANT
NS IRRIG 500ML POUR BTL (IV SOLUTION) ×3 IMPLANT
OBTURATOR OPTICAL STANDARD 8MM (TROCAR) ×2
OBTURATOR OPTICAL STND 8 DVNC (TROCAR) ×1
OBTURATOR OPTICALSTD 8 DVNC (TROCAR) ×1 IMPLANT
PACK LAP CHOLECYSTECTOMY (MISCELLANEOUS) ×3 IMPLANT
PENCIL ELECTRO HAND CTR (MISCELLANEOUS) ×3 IMPLANT
POUCH SPECIMEN RETRIEVAL 10MM (ENDOMECHANICALS) ×3 IMPLANT
SEAL CANN UNIV 5-8 DVNC XI (MISCELLANEOUS) ×3 IMPLANT
SEAL XI 5MM-8MM UNIVERSAL (MISCELLANEOUS) ×6
SET TUBE SMOKE EVAC HIGH FLOW (TUBING) ×3 IMPLANT
SOLUTION ELECTROLUBE (MISCELLANEOUS) ×3 IMPLANT
SPONGE T-LAP 18X18 ~~LOC~~+RFID (SPONGE) ×3 IMPLANT
STAPLER CANNULA SEAL DVNC XI (STAPLE) ×1 IMPLANT
STAPLER CANNULA SEAL XI (STAPLE) ×2
SUT MNCRL AB 4-0 PS2 18 (SUTURE) ×3 IMPLANT
SUT VICRYL 0 AB UR-6 (SUTURE) ×6 IMPLANT
SYR 20ML LL LF (SYRINGE) ×3 IMPLANT
SYR 30ML LL (SYRINGE) ×3 IMPLANT
TAPE TRANSPORE STRL 2 31045 (GAUZE/BANDAGES/DRESSINGS) ×3 IMPLANT
TROCAR BALLN GELPORT 12X130M (ENDOMECHANICALS) ×3 IMPLANT
WATER STERILE IRR 500ML POUR (IV SOLUTION) ×3 IMPLANT

## 2021-04-09 NOTE — Anesthesia Postprocedure Evaluation (Signed)
Anesthesia Post Note  Patient: Tara Mathews  Procedure(s) Performed: XI ROBOTIC ASSISTED LAPAROSCOPIC CHOLECYSTECTOMY INDOCYANINE GREEN FLUORESCENCE IMAGING (ICG)  Patient location during evaluation: PACU Anesthesia Type: General Level of consciousness: awake and alert, awake and oriented Pain management: pain level controlled Vital Signs Assessment: post-procedure vital signs reviewed and stable Respiratory status: spontaneous breathing, nonlabored ventilation and respiratory function stable Cardiovascular status: blood pressure returned to baseline and stable Postop Assessment: no apparent nausea or vomiting Anesthetic complications: no   No notable events documented.   Last Vitals:  Vitals:   04/09/21 1315 04/09/21 1344  BP: 122/72 116/79  Pulse: 78 85  Resp: 13 14  Temp: (!) 36.1 C (!) 36.2 C  SpO2: 97% 100%    Last Pain:  Vitals:   04/09/21 1344  TempSrc: Temporal  PainSc: 6                  Phill Mutter

## 2021-04-09 NOTE — Anesthesia Preprocedure Evaluation (Signed)
Anesthesia Evaluation    Airway Mallampati: II  TM Distance: >3 FB Neck ROM: Full    Dental no notable dental hx.    Pulmonary    Pulmonary exam normal        Cardiovascular hypertension (Not according to pt No meds), Normal cardiovascular exam     Neuro/Psych Anxiety    GI/Hepatic GERD  Medicated and Controlled,  Endo/Other  Morbid obesity  Renal/GU      Musculoskeletal   Abdominal   Peds  Hematology   Anesthesia Other Findings   Reproductive/Obstetrics                             Anesthesia Physical Anesthesia Plan  ASA: 2  Anesthesia Plan: General   Post-op Pain Management:    Induction: Intravenous  PONV Risk Score and Plan: 3 and Propofol infusion, Midazolam and Ondansetron  Airway Management Planned: Oral ETT  Additional Equipment:   Intra-op Plan:   Post-operative Plan: Extubation in OR  Informed Consent: I have reviewed the patients History and Physical, chart, labs and discussed the procedure including the risks, benefits and alternatives for the proposed anesthesia with the patient or authorized representative who has indicated his/her understanding and acceptance.       Plan Discussed with: CRNA, Anesthesiologist and Surgeon  Anesthesia Plan Comments:         Anesthesia Quick Evaluation

## 2021-04-09 NOTE — Discharge Instructions (Addendum)
Laparoscopic Cholecystectomy, Care After  ° °These instructions give you information on caring for yourself after your procedure. Your doctor may also give you more specific instructions. Call your doctor if you have any problems or questions after your procedure.  °HOME CARE  °Change your bandages (dressings) as told by your doctor.  °Keep the wound dry and clean. Wash the wound gently with soap and water. Pat the wound dry with a clean towel.  °Do not take baths, swim, or use hot tubs for 2 weeks, or as told by your doctor.  °Only take medicine as told by your doctor.  °Eat a normal diet as told by your doctor.  °Do not lift anything heavier than 10 pounds (4.5 kg) until your doctor says it is okay.  °Do not play contact sports for 1 week, or as told by your doctor. °GET HELP IF:  °Your wound is red, puffy (swollen), or painful.  °You have yellowish-white fluid (pus) coming from the wound.  °You have fluid draining from the wound for more than 1 day.  °You have a bad smell coming from the wound.  °Your wound breaks open. °GET HELP RIGHT AWAY IF:  °You have trouble breathing.  °You have chest pain.  °You have a fever >101  °You have pain in the shoulders (shoulder strap areas) that is getting worse.  °You feel dizzy or pass out (faint).  °You have severe belly (abdominal) pain.  °You feel sick to your stomach (nauseous) or throw up (vomit) for more than 1 day. ° °AMBULATORY SURGERY  °DISCHARGE INSTRUCTIONS ° ° °The drugs that you were given will stay in your system until tomorrow so for the next 24 hours you should not: ° °Drive an automobile °Make any legal decisions °Drink any alcoholic beverage ° ° °You may resume regular meals tomorrow.  Today it is better to start with liquids and gradually work up to solid foods. ° °You may eat anything you prefer, but it is better to start with liquids, then soup and crackers, and gradually work up to solid foods. ° ° °Please notify your doctor immediately if you have any  unusual bleeding, trouble breathing, redness and pain at the surgery site, drainage, fever, or pain not relieved by medication. ° ° ° °Additional Instructions: °Please contact your physician with any problems or Same Day Surgery at [phone], Monday through Friday 6 am to 4 pm, or [place] [facility] Main number at [phone].  °  °

## 2021-04-09 NOTE — Interval H&P Note (Signed)
History and Physical Interval Note:  04/09/2021 9:42 AM  Tara Mathews  has presented today for surgery, with the diagnosis of biliary colie.  The various methods of treatment have been discussed with the patient and family. After consideration of risks, benefits and other options for treatment, the patient has consented to  Procedure(s): XI ROBOTIC ASSISTED LAPAROSCOPIC CHOLECYSTECTOMY (N/A) [place] (ICG) (N/A) as a surgical intervention.  The patient's history has been reviewed, patient examined, no change in status, stable for surgery.  I have reviewed the patient's chart and labs.  Questions were answered to the patient's satisfaction.     [place]

## 2021-04-09 NOTE — Transfer of Care (Signed)
Immediate Anesthesia Transfer of Care Note  Patient: Tara Mathews  Procedure(s) Performed: XI ROBOTIC ASSISTED LAPAROSCOPIC CHOLECYSTECTOMY INDOCYANINE GREEN FLUORESCENCE IMAGING (ICG)  Patient Location: PACU  Anesthesia Type:General  Level of Consciousness: awake, alert  and confused  Airway & Oxygen Therapy: Patient Spontanous Breathing  Post-op Assessment: Report given to RN and Post -op Vital signs reviewed and stable  Post vital signs: Reviewed and stable  Last Vitals:  Vitals Value Taken Time  BP 135/90 04/09/21 1205  Temp 36.2 C 04/09/21 1205  Pulse 74 04/09/21 1215  Resp 25 04/09/21 1215  SpO2 91 % 04/09/21 1215  Vitals shown include unvalidated device data.  Last Pain:  Vitals:   04/09/21 1014  TempSrc: Tympanic      Patients Stated Pain Goal: 0 (90/93/11 2162)  Complications: No notable events documented.

## 2021-04-09 NOTE — Op Note (Signed)
Robotic assisted laparoscopic Cholecystectomy  Pre-operative Diagnosis: Chronic cholecystitis  Post-operative Diagnosis: same  Procedure:  Robotic assisted laparoscopic Cholecystectomy  Surgeon: Caroleen Hamman, MD FACS  Anesthesia: Gen. with endotracheal tube  Findings: Chronic Cholecystitis   Estimated Blood Loss: 5cc       Specimens: Gallbladder           Complications: none   Procedure Details  The patient was seen again in the Holding Room. The benefits, complications, treatment options, and expected outcomes were discussed with the patient. The risks of bleeding, infection, recurrence of symptoms, failure to resolve symptoms, bile duct damage, bile duct leak, retained common bile duct stone, bowel injury, any of which could require further surgery and/or ERCP, stent, or papillotomy were reviewed with the patient. The likelihood of improving the patient's symptoms with return to their baseline status is good.  The patient and/or family concurred with the proposed plan, giving informed consent.  The patient was taken to Operating Room, identified  and the procedure verified as Laparoscopic Cholecystectomy.  A Time Out was held and the above information confirmed.  Prior to the induction of general anesthesia, antibiotic prophylaxis was administered. VTE prophylaxis was in place. General endotracheal anesthesia was then administered and tolerated well. After the induction, the abdomen was prepped with Chloraprep and draped in the sterile fashion. The patient was positioned in the supine position.  Cut down technique was used to enter the abdominal cavity and a Hasson trochar was placed after two vicryl stitches were anchored to the fascia. Pneumoperitoneum was then created with CO2 and tolerated well without any adverse changes in the patient's vital signs.  Three 8-mm ports were placed under direct vision. All skin incisions  were infiltrated with a local anesthetic agent before making  the incision and placing the trocars.   The patient was positioned  in reverse Trendelenburg, robot was brought to the surgical field and docked in the standard fashion.  We made sure all the instrumentation was kept indirect view at all times and that there were no collision between the arms. I scrubbed out and went to the console.  The gallbladder was identified, the fundus grasped and retracted cephalad. Adhesions were lysed bluntly. The infundibulum was grasped and retracted laterally, exposing the peritoneum overlying the triangle of Calot. This was then divided and exposed in a blunt fashion. An extended critical view of the cystic duct and cystic artery was obtained.  The cystic duct was clearly identified and bluntly dissected.   Artery and duct were double clipped and divided. Using ICG cholangiography we visualize the cystic duct and CBD no evidence of abnormal ductal anatomy or evidence of bile injuries. The gallbladder was taken from the gallbladder fossa in a retrograde fashion with the electrocautery.  Hemostasis was achieved with the electrocautery. nspection of the right upper quadrant was performed. No bleeding, bile duct injury or leak, or bowel injury was noted. Robotic instruments and robotic arms were undocked in the standard fashion.  I scrubbed back in.  The gallbladder was removed and placed in an Endocatch bag.   Pneumoperitoneum was released.  The periumbilical port site was closed with interrumpted 0 Vicryl sutures. 4-0 subcuticular Monocryl was used to close the skin. Dermabond was  applied.  The patient was then extubated and brought to the recovery room in stable condition. Sponge, lap, and needle counts were correct at closure and at the conclusion of the case.  Caroleen Hamman, MD, FACS

## 2021-04-09 NOTE — Anesthesia Procedure Notes (Signed)
Procedure Name: Intubation Date/Time: 04/09/2021 10:46 AM Performed by: Debe Coder, CRNA Pre-anesthesia Checklist: Patient identified, Emergency Drugs available, Suction available and Patient being monitored Patient Re-evaluated:Patient Re-evaluated prior to induction Oxygen Delivery Method: Circle system utilized Preoxygenation: Pre-oxygenation with 100% oxygen Induction Type: IV induction Ventilation: Mask ventilation without difficulty Grade View: Grade II Tube type: Oral Tube size: 6.5 mm Number of attempts: 1 Airway Equipment and Method: Stylet and Oral airway Placement Confirmation: ETT inserted through vocal cords under direct vision, positive ETCO2 and breath sounds checked- equal and bilateral Secured at: 20 cm Tube secured with: Tape Dental Injury: Teeth and Oropharynx as per pre-operative assessment

## 2021-04-10 ENCOUNTER — Telehealth: Payer: Self-pay | Admitting: *Deleted

## 2021-04-10 LAB — SURGICAL PATHOLOGY

## 2021-04-10 NOTE — Telephone Encounter (Signed)
Spoke with patient she stated her incision was a little pinkish-denies drainage-denies fever- she was instructed to apply heat or ice to the area. She can allow soapy water to run over her abdomen but do not scrub over the area.

## 2021-04-10 NOTE — Telephone Encounter (Signed)
Patient called and stated that she had surgery yesterday with Dr.Pabon gallbladder removed. She is worried because one of her incision is red and warm to the touch. The other incisions look fine. There is no drainage. Please call and advise

## 2021-04-29 ENCOUNTER — Encounter: Payer: Self-pay | Admitting: Surgery

## 2021-04-29 ENCOUNTER — Other Ambulatory Visit: Payer: Self-pay

## 2021-04-29 ENCOUNTER — Ambulatory Visit (INDEPENDENT_AMBULATORY_CARE_PROVIDER_SITE_OTHER): Payer: Self-pay | Admitting: Surgery

## 2021-04-29 VITALS — Temp 98.3°F | Ht 60.0 in | Wt 204.0 lb

## 2021-04-29 DIAGNOSIS — Z09 Encounter for follow-up examination after completed treatment for conditions other than malignant neoplasm: Secondary | ICD-10-CM

## 2021-04-29 DIAGNOSIS — K811 Chronic cholecystitis: Secondary | ICD-10-CM

## 2021-04-29 NOTE — Patient Instructions (Addendum)
Please call our office with any questions or concerns.    GENERAL POST-OPERATIVE PATIENT INSTRUCTIONS   WOUND CARE INSTRUCTIONS:  Keep a dry clean dressing on the wound if there is drainage. The initial bandage may be removed after 24 hours.  Once the wound has quit draining you may leave it open to air.  If clothing rubs against the wound or causes irritation and the wound is not draining you may cover it with a dry dressing during the daytime.  Try to keep the wound dry and avoid ointments on the wound unless directed to do so.  If the wound becomes bright red and painful or starts to drain infected material that is not clear, please contact your physician immediately.  If the wound is mildly pink and has a thick firm ridge underneath it, this is normal, and is referred to as a healing ridge.  This will resolve over the next 4-6 weeks.  BATHING: You may shower if you have been informed of this by your surgeon. However, Please do not submerge in a tub, hot tub, or pool until incisions are completely sealed or have been told by your surgeon that you may do so.  DIET:  You may eat any foods that you can tolerate.  It is a good idea to eat a high fiber diet and take in plenty of fluids to prevent constipation.  If you do become constipated you may want to take a mild laxative or take ducolax tablets on a daily basis until your bowel habits are regular.  Constipation can be very uncomfortable, along with straining, after recent surgery.  ACTIVITY:  You are encouraged to cough and deep breath or use your incentive spirometer if you were given one, every 15-30 minutes when awake.  This will help prevent respiratory complications and low grade fevers post-operatively if you had a general anesthetic.  You may want to hug a pillow when coughing and sneezing to add additional support to the surgical area, if you had abdominal or chest surgery, which will decrease pain during these times.  You are encouraged to  walk and engage in light activity for the next two weeks.  You should not lift more than 20 pounds for 4 weeks after surgery as it could put you at increased risk for complications.  Twenty pounds is roughly equivalent to a plastic bag of groceries. At that time- Listen to your body when lifting, if you have pain when lifting, stop and then try again in a few days. Soreness after doing exercises or activities of daily living is normal as you get back in to your normal routine.  MEDICATIONS:  Try to take narcotic medications and anti-inflammatory medications, such as tylenol, ibuprofen, naprosyn, etc., with food.  This will minimize stomach upset from the medication.  Should you develop nausea and vomiting from the pain medication, or develop a rash, please discontinue the medication and contact your physician.  You should not drive, make important decisions, or operate machinery when taking narcotic pain medication.  SUNBLOCK Use sun block to incision area over the next year if this area will be exposed to sun. This helps decrease scarring and will allow you avoid a permanent darkened area over your incision.  QUESTIONS:  Please feel free to call our office if you have any questions, and we will be glad to assist you.

## 2021-04-30 NOTE — Progress Notes (Signed)
Tara Mathews is 2 and half weeks after robotic cholecystectomy. Path discussed with patient. She is doing very well.  Some itching around periumbilical incision. some Intermittent diarrhea  PE: NAD, alert Abd: soft , patient is healing well without evidence of infection.  Nontender.  A/P Doing  well without complications. May add imodium RTC as needed

## 2021-05-19 ENCOUNTER — Other Ambulatory Visit: Payer: Self-pay

## 2021-05-19 ENCOUNTER — Encounter (HOSPITAL_COMMUNITY): Payer: Self-pay | Admitting: Emergency Medicine

## 2021-05-19 ENCOUNTER — Ambulatory Visit (HOSPITAL_COMMUNITY)
Admission: EM | Admit: 2021-05-19 | Discharge: 2021-05-19 | Disposition: A | Discharge status: Home / Self Care | Payer: Self-pay | Attending: Student | Admitting: Student

## 2021-05-19 DIAGNOSIS — Z9049 Acquired absence of other specified parts of digestive tract: Secondary | ICD-10-CM | POA: Insufficient documentation

## 2021-05-19 DIAGNOSIS — N898 Other specified noninflammatory disorders of vagina: Secondary | ICD-10-CM | POA: Insufficient documentation

## 2021-05-19 DIAGNOSIS — N3001 Acute cystitis with hematuria: Secondary | ICD-10-CM | POA: Insufficient documentation

## 2021-05-19 LAB — POCT URINALYSIS DIPSTICK, ED / UC
Bilirubin Urine: NEGATIVE
Glucose, UA: NEGATIVE mg/dL
Ketones, ur: NEGATIVE mg/dL
Nitrite: NEGATIVE
Protein, ur: 30 mg/dL — AB
Specific Gravity, Urine: 1.02 (ref 1.005–1.030)
Urobilinogen, UA: 0.2 mg/dL (ref 0.0–1.0)
pH: 7 (ref 5.0–8.0)

## 2021-05-19 LAB — POC URINE PREG, ED: Preg Test, Ur: NEGATIVE

## 2021-05-19 MED ORDER — CEPHALEXIN 500 MG PO CAPS
500.0000 mg | ORAL_CAPSULE | Freq: Four times a day (QID) | ORAL | 0 refills | Status: DC
  Filled 2021-05-19: qty 20, 5d supply, fill #0

## 2021-05-19 NOTE — ED Provider Notes (Addendum)
[place]    CSN: [id number] Arrival date & time: 05/19/21  1443      History   Chief Complaint Chief Complaint  Patient presents with   Urinary Tract Infection    HPI Tara Mathews is a 31 y.o. female presenting with urinary symptoms for about 2 days.  Moderate call history cholecystectomy 3 weeks ago.  Also with history of dermoid cyst of left ovary.  Describes 2 days of suprapubic pressure, bilateral flank pain, urinary frequency, urinary urgency, foul odor to urine.  Also with clear vaginal discharge and some vaginal spotting.  States she has increased her liquid consumption without relief.  Denies fever/chills, vaginal rashes or lesions.  Last period was about 3 days ago. Endorses diarrhea but no other change in bowel or bladder function.  HPI  Past Medical History:  Diagnosis Date   Anxiety    GERD (gastroesophageal reflux disease)     Patient Active Problem List   Diagnosis Date Noted   Postoperative visit 07/23/2020   Postoperative complication of skin involving drainage from surgical wound 07/02/2020   Encounter for post surgical wound check 06/24/2020   Dermoid cyst of ovary 06/17/2020   Dermoid cyst of ovary, left 03/12/2020   Breast mass in female 03/12/2020   Galactorrhea in female 03/12/2020   Shoulder dystocia during labor and delivery 05/08/2019   Labor and delivery, indication for care 05/07/2019   Preeclampsia 05/07/2019   Positive GBS test 05/02/2019   Obesity affecting pregnancy, antepartum 10/18/2018   Encounter for supervision of normal pregnancy 10/17/2018    Past Surgical History:  Procedure Laterality Date   GALLBLADDER SURGERY     LAPAROTOMY N/A 06/17/2020   Procedure: LAPAROTOMY;  Surgeon: Griffin Basil, MD;  Location: [place];  Service: Gynecology;  Laterality: N/A;   OVARIAN CYST REMOVAL Left 06/17/2020   Procedure: OPEN OVARIAN CYSTECTOMY;  Surgeon: Griffin Basil, MD;  Location: [place];  Service: Gynecology;   Laterality: Left;    OB History     Gravida  1   Para  1   Term  1   Preterm      AB      Living  1      SAB      IAB      Ectopic      Multiple  0   Live Births  1            Home Medications    Prior to Admission medications   Medication Sig Start Date End Date Taking? Authorizing Provider  cephALEXin (KEFLEX) 500 MG capsule Take 1 capsule (500 mg total) by mouth 4 (four) times daily. 05/19/21  Yes Hazel Sams, PA-C  ELDERBERRY PO Take 1 capsule by mouth as needed.    [provider]  Polyvinyl Alcohol-Povidone (REFRESH OP) Place 1 drop into both eyes daily as needed (dry eyes).    [provider]    Family History Family History  Problem Relation Age of Onset   96 / Stillbirths Maternal Aunt    Cancer Maternal Uncle        Brain tumor   Diabetes Paternal Grandmother    Obesity Mother    Arthritis Father    Hypertension Father    Obesity Father     Social History Social History   Tobacco Use   Smoking status: Never   Smokeless tobacco: Never  Vaping Use   Vaping Use: Never used  Substance Use Topics  Alcohol use: No   Drug use: No     Allergies   Bactrim [sulfamethoxazole-trimethoprim]   Review of Systems Review of Systems  Constitutional:  Negative for appetite change, chills, diaphoresis and fever.  Respiratory:  Negative for shortness of breath.   Cardiovascular:  Negative for chest pain.  Gastrointestinal:  Positive for abdominal pain. Negative for blood in stool, constipation, diarrhea, nausea and vomiting.  Genitourinary:  Positive for dysuria, frequency, urgency and vaginal discharge. Negative for decreased urine volume, difficulty urinating, flank pain, genital sores and hematuria.  Musculoskeletal:  Negative for back pain.  Neurological:  Negative for dizziness, weakness and light-headedness.  All other systems reviewed and are negative.   Physical Exam Triage Vital Signs ED Triage  Vitals  Enc Vitals Group     BP 05/19/21 1522 116/79     Pulse Rate 05/19/21 1522 94     Resp 05/19/21 1522 18     Temp 05/19/21 1522 98.5 F (36.9 C)     Temp Source 05/19/21 1522 Oral     SpO2 05/19/21 1522 97 %     Weight --      Height --      Head Circumference --      Peak Flow --      Pain Score 05/19/21 1519 7     Pain Loc --      Pain Edu? --      Excl. in [place]? --    No data found.  Updated Vital Signs BP 116/79 (BP Location: Left Arm)    Pulse 94    Temp 98.5 F (36.9 C) (Oral)    Resp 18    LMP 05/09/2021    SpO2 97%   Visual Acuity Right Eye Distance:   Left Eye Distance:   Bilateral Distance:    Right Eye Near:   Left Eye Near:    Bilateral Near:     Physical Exam Vitals reviewed.  Constitutional:      General: She is not in acute distress.    Appearance: Normal appearance. She is not ill-appearing.  HENT:     Head: Normocephalic and atraumatic.     Mouth/Throat:     Mouth: Mucous membranes are moist.     Comments: Moist mucous membranes Eyes:     Extraocular Movements: Extraocular movements intact.     Pupils: Pupils are equal, round, and reactive to light.  Cardiovascular:     Rate and Rhythm: Normal rate and regular rhythm.     Heart sounds: Normal heart sounds.  Pulmonary:     Effort: Pulmonary effort is normal.     Breath sounds: Normal breath sounds. No wheezing, rhonchi or rales.  Abdominal:     General: Bowel sounds are normal. There is no distension.     Palpations: Abdomen is soft. There is no mass.     Tenderness: There is abdominal tenderness in the right lower quadrant, suprapubic area and left lower quadrant. There is no right CVA tenderness, left CVA tenderness, guarding or rebound. Negative signs include Murphy's sign, Rovsing's sign and McBurney's sign.     Comments: Well healed laparoscopic surgical scars visible.  There is is tenderness to palpation in the LLQ, RLQ, and suprapubic regions. No reproducible CVAT. No mass, hernia,  guarding, rebound. Comfortable throughout exam.  Skin:    General: Skin is warm.     Capillary Refill: Capillary refill takes less than 2 seconds.     Comments: Good skin turgor  Neurological:  General: No focal deficit present.     Mental Status: She is alert and oriented to person, place, and time.  Psychiatric:        Mood and Affect: Mood normal.        Behavior: Behavior normal.     UC Treatments / Results  Labs (all labs ordered are listed, but only abnormal results are displayed) Labs Reviewed  POCT URINALYSIS DIPSTICK, ED / UC - Abnormal; Notable for the following components:      Result Value   Hgb urine dipstick SMALL (*)    Protein, ur 30 (*)    Leukocytes,Ua SMALL (*)    All other components within normal limits  POC URINE PREG, ED    EKG   Radiology No results found.  Procedures Procedures (including critical care time)  Medications Ordered in UC Medications - No data to display  Initial Impression / Assessment and Plan / UC Course  I have reviewed the triage vital signs and the nursing notes.  Pertinent labs & imaging results that were available during my care of the patient were reviewed by me and considered in my medical decision making (see chart for details).     This patient is a very pleasant 31 y.o. year old female presenting with suspected cystitis. Afebrile, nontachycardic, no reproducible CVAT. Low concern for pyelo today. She does have a history of dermoid cyst L ovary; no palpable mass on exam today. Also with recent laparoscopic cholecystectomy 04/09/21. She is also having some vaginal discharge and spotting today.  Denies STI risk. Will send self-swab for G/C, trich, yeast, BV testing. Declines HIV, RPR. Safe sex precautions.  UA with small blood and small leuk. Culture sent.  U-preg negative.   Will manage as cystitis with keflex pending test results.   STRICT ED return precautions discussed. Call surgeon if abd pain persists in 24  hours, or if pain worsens at all before then. Patient verbalizes understanding and agreement.   Discussed treatment plan with attending physician Dr. Mannie Stabile who is in agreement.   Final Clinical Impressions(s) / UC Diagnoses   Final diagnoses:  Acute cystitis with hematuria  Vaginal discharge  History of cholecystectomy     Discharge Instructions      -Start the antibiotic: Keflex, 4x daily x5 days. You can take this with food if you have a sensitive stomach. -You may have vaginitis (BV). We're also testing for this today. We'll call in 2-3 days with results and could send additional treatment.  -If your abdominal pain persists after about a day of treatment, or if this is getting worse at all, call your surgeon.  If they are unable to see you, head to the emergency department.   ED Prescriptions     Medication Sig Dispense Auth. Provider   cephALEXin (KEFLEX) 500 MG capsule Take 1 capsule (500 mg total) by mouth 4 (four) times daily. 20 capsule Hazel Sams, PA-C      PDMP not reviewed this encounter.   Hazel Sams, PA-C 05/19/21 1612    Hazel Sams, PA-C 05/19/21 1616

## 2021-05-19 NOTE — Discharge Instructions (Addendum)
-  Start the antibiotic: Keflex, 4x daily x5 days. You can take this with food if you have a sensitive stomach. -You may have vaginitis (BV). We're also testing for this today. We'll call in 2-3 days with results and could send additional treatment.  -If your abdominal pain persists after about a day of treatment, or if this is getting worse at all, call your surgeon.  If they are unable to see you, head to the emergency department.

## 2021-05-19 NOTE — ED Triage Notes (Signed)
History of uti.  Pressure in low abdomen, low back pain, urinary frequency, urgency, odor to urine.  Patient has been drinking more liquids, but pain continues.  Onset of symptoms was Sunday.  Last menstrual period ended Saturday.

## 2021-05-20 ENCOUNTER — Telehealth (HOSPITAL_COMMUNITY): Payer: Self-pay | Admitting: Emergency Medicine

## 2021-05-20 ENCOUNTER — Other Ambulatory Visit: Payer: Self-pay

## 2021-05-20 LAB — CERVICOVAGINAL ANCILLARY ONLY
Bacterial Vaginitis (gardnerella): POSITIVE — AB
Candida Glabrata: NEGATIVE
Candida Vaginitis: NEGATIVE
Chlamydia: NEGATIVE
Comment: NEGATIVE
Comment: NEGATIVE
Comment: NEGATIVE
Comment: NEGATIVE
Comment: NEGATIVE
Comment: NORMAL
Neisseria Gonorrhea: NEGATIVE
Trichomonas: NEGATIVE

## 2021-05-20 MED ORDER — METRONIDAZOLE 500 MG PO TABS
500.0000 mg | ORAL_TABLET | Freq: Two times a day (BID) | ORAL | 0 refills | Status: DC
  Filled 2021-05-20: qty 14, 7d supply, fill #0

## 2021-05-22 ENCOUNTER — Other Ambulatory Visit: Payer: Self-pay

## 2021-05-22 LAB — URINE CULTURE: Culture: 80000 — AB

## 2021-07-10 ENCOUNTER — Ambulatory Visit: Payer: Self-pay

## 2021-07-10 NOTE — Telephone Encounter (Signed)
Pt stated experiencing left breast pain under the armpit. Pt stated still has milk when she massages due to pain. Pt stated has seen OBGYN and advised that she see PCP.  ? ?Pt has an appointment for 08/05/2021  ? ?Seeking clinical advice.  ? ? ? ?Chief Complaint: Left breast pain x 2 years, getting worse.Has seen OB/GYN about this. ?Symptoms: Radiates to arm pit. ?Frequency: 2012 ?Pertinent Negatives: Patient denies  ?Disposition: '[]'$ ED /'[]'$ Urgent Care (no appt availability in office) / '[]'$ Appointment(In office/virtual)/ '[]'$  [facility]/ '[]'$ Home Care/ '[]'$ Refused Recommended Disposition /'[]'$  Mobile Bus/ '[]'$  Follow-up with PCP ?Additional Notes: Has appointment 08/05/21. Requests to be seen sooner if possible. Please advise.  ?Answer Assessment - Initial Assessment Questions ?1. SYMPTOM: "What's the main symptom you're concerned about?"  (e.g., lump, pain, rash, nipple discharge) ?    Left breast pain ?2. LOCATION: "Where is the pain located?" ?    Left breast ?3. ONSET: "When did pain  start?" ?    July 2021 ?4. PRIOR HISTORY: "Do you have any history of prior problems with your breasts?" (e.g., lumps, cancer, fibrocystic breast disease) ?    No ?5. CAUSE: "What do you think is causing this symptom?" ?    Unsure ?6. OTHER SYMPTOMS: "Do you have any other symptoms?" (e.g., fever, breast pain, redness or rash, nipple discharge) ?    Has some milk that discharges ?7. PREGNANCY-BREASTFEEDING: "Is there any chance you are pregnant?" "When was your last menstrual period?" "Are you breastfeeding?" ?    No ? ?Protocols used: Breast Symptoms-A-AH ? ?

## 2021-07-11 NOTE — Telephone Encounter (Signed)
I will see her in April. Please let her know she was advised by OB GYN a few months ago to stop squeezing her nipple. Also breast US of same area was normal. At this time unless I feel a lump at her office visit we will only continue to follow along without any imaging.  ?

## 2021-07-11 NOTE — Telephone Encounter (Signed)
She can take ibuprofen and tylenol for pain. No massages to the breast.  ?

## 2021-07-13 NOTE — Telephone Encounter (Signed)
Called pt unable to reach left VM to call back  ?

## 2021-07-14 ENCOUNTER — Ambulatory Visit: Payer: Self-pay | Admitting: Nurse Practitioner

## 2021-07-15 NOTE — Telephone Encounter (Signed)
Called pt made aware of MD/PA/NP results note and instructions. Verbalized understanding and agreement. ?

## 2021-07-23 ENCOUNTER — Encounter: Payer: Self-pay | Admitting: Physician Assistant

## 2021-07-23 ENCOUNTER — Ambulatory Visit: Payer: Self-pay | Attending: Physician Assistant | Admitting: Physician Assistant

## 2021-07-23 VITALS — BP 125/83 | HR 88 | Wt 210.4 lb

## 2021-07-23 DIAGNOSIS — R739 Hyperglycemia, unspecified: Secondary | ICD-10-CM

## 2021-07-23 DIAGNOSIS — N644 Mastodynia: Secondary | ICD-10-CM

## 2021-07-23 DIAGNOSIS — N6452 Nipple discharge: Secondary | ICD-10-CM

## 2021-07-23 NOTE — Progress Notes (Signed)
Patient ID: Tara Mathews, female   DOB: 09-08-1990, 31 y.o.   MRN: [id number] ? ? ?Tara Mathews, is a 31 y.o. female ? ?UXL:[id number] ? ?[id number] ? ?DOB - 11/06/1990 ? ?Chief Complaint  ?Patient presents with  ? Breast Pain  ?    ? ?Subjective:  ? ?Tara Mathews is a 31 y.o. female here today for continued problems with esp L breast.  ~18 mths ago started noticing small lumpy area in L breast.  This area was ultrasounded and did not show any concern in 03/2020.  At the time, she was 2 months past cessation of breast feeding and still having some breast milk.  Now she is able to express some from her breast if she massages the area.  She is currently not using BC.  Periods regular. LMP 07/07/2021.  She feels the lumpy area has become more prominent.   ? ?No HA/vision changes.   ? ?Great maternal aunt with breast CA in her 30s. ? ?No problems updated. ? ?ALLERGIES: ?Allergies  ?Allergen Reactions  ? Bactrim [Sulfamethoxazole-Trimethoprim] Hives  ? ? ?PAST MEDICAL HISTORY: ?Past Medical History:  ?Diagnosis Date  ? Anxiety   ? GERD (gastroesophageal reflux disease)   ? ? ?MEDICATIONS AT HOME: ?Prior to Admission medications   ?Medication Sig Start Date End Date Taking? Authorizing Provider  ?cephALEXin (KEFLEX) 500 MG capsule Take 1 capsule (500 mg total) by mouth 4 (four) times daily. 05/19/21   Hazel Sams, PA-C  ?ELDERBERRY PO Take 1 capsule by mouth as needed.    [provider]  ?metroNIDAZOLE (FLAGYL) 500 MG tablet Take 1 tablet (500 mg total) by mouth 2 (two) times daily. 05/20/21   LampteyMyrene Galas, MD  ?Polyvinyl Alcohol-Povidone (REFRESH OP) Place 1 drop into both eyes daily as needed (dry eyes).    [provider]  ? ? ?ROS: ?Neg HEENT ?Neg resp ?Neg cardiac ?Neg GI ?Neg GU ?Neg MS ?Neg psych ?Neg neuro ? ?Objective:  ? ?Vitals:  ? 07/23/21 1429  ?BP: 125/83  ?Pulse: 88  ?SpO2: 98%  ?Weight: 210 lb 6.4 oz (95.4 kg)  ? ?Exam ?General appearance : Awake,  alert, not in any distress. Speech Clear. Not toxic looking ?HEENT: Atraumatic and Normocephalic ?Neck: Supple, no JVD. No cervical lymphadenopathy.  ?Chest: Good air entry bilaterally, CTAB.  No rales/rhonchi/wheezing ?CVS: S1 S2 regular, no murmurs.  ?Large pendulous breasts.  No skin changes.  Various areas tender without any discrete lumps or masses.  Axilla w/o lump or mass ?Extremities: B/L Lower Ext shows no edema, both legs are warm to touch ?Neurology: Awake alert, and oriented X 3, CN II-XII intact, Non focal ?Skin: No Rash ? ?Data Review ?Lab Results  ?Component Value Date  ? HGBA1C 5.6 11/28/2020  ? HGBA1C 5.3 06/04/2020  ? ? ?Assessment & Plan  ? ?1. Breast tenderness ?Will repeat L breast U/S to ensure no changes since 03/2020.  Likely fibrocystic/hormonal changes.   ?- Prolactin ?- Thyroid Panel With TSH ?- CBC with Differential/Platelet ?- US BREAST ASPIRATION LEFT; Future ? ?2. Nipple discharge ?- Prolactin ?- Thyroid Panel With TSH ?- US BREAST ASPIRATION LEFT; Future ? ?3. Hyperglycemia ?I have had a lengthy discussion and provided education about insulin resistance and the intake of too much sugar/refined carbohydrates.  I have advised the patient to work at a goal of eliminating sugary drinks, candy, desserts, sweets, refined sugars, processed foods, and white carbohydrates.  The patient expresses understanding.  ?- Hemoglobin A1c ? ? ? ?  Patient have been counseled extensively about nutrition and exercise. Other issues discussed during this visit include: low cholesterol diet, weight control and daily exercise, foot care, annual eye examinations at Ophthalmology, importance of adherence with medications and regular follow-up. We also discussed long term complications of uncontrolled diabetes and hypertension.  ? ?Return in about 6 months (around 01/22/2022). ? ?The patient was given clear instructions to go to ER or return to medical center if symptoms don't improve, worsen or new problems  develop. The patient verbalized understanding. The patient was told to call to get lab results if they haven't heard anything in the next week.  ? ? ? ? ?Freeman Caldron, PA-C ?[place] ?[place], [place] ?[phone]   ?07/23/2021, 2:52 PM  ?

## 2021-07-24 LAB — CBC WITH DIFFERENTIAL/PLATELET
Basophils Absolute: 0.1 10*3/uL (ref 0.0–0.2)
Basos: 1 %
EOS (ABSOLUTE): 0.1 10*3/uL (ref 0.0–0.4)
Eos: 1 %
Hematocrit: 40.8 % (ref 34.0–46.6)
Hemoglobin: 13.6 g/dL (ref 11.1–15.9)
Immature Grans (Abs): 0 10*3/uL (ref 0.0–0.1)
Immature Granulocytes: 0 %
Lymphocytes Absolute: 2.6 10*3/uL (ref 0.7–3.1)
Lymphs: 26 %
MCH: 28.2 pg (ref 26.6–33.0)
MCHC: 33.3 g/dL (ref 31.5–35.7)
MCV: 85 fL (ref 79–97)
Monocytes Absolute: 0.5 10*3/uL (ref 0.1–0.9)
Monocytes: 5 %
Neutrophils Absolute: 6.6 10*3/uL (ref 1.4–7.0)
Neutrophils: 67 %
Platelets: 381 10*3/uL (ref 150–450)
RBC: 4.83 x10E6/uL (ref 3.77–5.28)
RDW: 12.4 % (ref 11.7–15.4)
WBC: 9.8 10*3/uL (ref 3.4–10.8)

## 2021-07-24 LAB — HEMOGLOBIN A1C
Est. average glucose Bld gHb Est-mCnc: 108 mg/dL
Hgb A1c MFr Bld: 5.4 % (ref 4.8–5.6)

## 2021-07-24 LAB — PROLACTIN: Prolactin: 24 ng/mL — ABNORMAL HIGH (ref 4.8–23.3)

## 2021-07-24 LAB — THYROID PANEL WITH TSH
Free Thyroxine Index: 2.5 (ref 1.2–4.9)
T3 Uptake Ratio: 29 % (ref 24–39)
T4, Total: 8.7 ug/dL (ref 4.5–12.0)
TSH: 1.14 u[IU]/mL (ref 0.450–4.500)

## 2021-08-05 ENCOUNTER — Ambulatory Visit: Payer: Self-pay | Admitting: Physician Assistant

## 2021-08-11 ENCOUNTER — Other Ambulatory Visit: Payer: Self-pay

## 2021-08-11 DIAGNOSIS — N6452 Nipple discharge: Secondary | ICD-10-CM

## 2021-10-12 ENCOUNTER — Telehealth: Payer: Self-pay | Admitting: Nurse Practitioner

## 2021-10-12 NOTE — Telephone Encounter (Unsigned)
Copied from [place]. Topic: General - Other >> Oct 12, 2021 12:52 PM Tara Mathews wrote: Reason for CRM: Pt is needing a form from the Dr stating she is pregnant, please advise.

## 2021-10-14 ENCOUNTER — Other Ambulatory Visit: Payer: Self-pay | Admitting: Nurse Practitioner

## 2021-10-14 DIAGNOSIS — Z3201 Encounter for pregnancy test, result positive: Secondary | ICD-10-CM

## 2021-10-14 NOTE — Telephone Encounter (Signed)
Attempt to call patient to let her know  She can come to for lab test tomorrow. Given office hours.   0830-11:10 to [phone]

## 2021-10-14 NOTE — Telephone Encounter (Signed)
Patient is requesting a pregnancy test to confirm pregancy so she can be in the adopt a mom program offered via the [facility].   She states she has an apt with [facility] on 7/18. Just needed her PCP confirmation that she is pregnant. They cannot take the home HCG test she has performed already.    Can she come in for the test without an appt?

## 2021-10-14 NOTE — Telephone Encounter (Signed)
Lab order has been placed.  

## 2021-10-15 ENCOUNTER — Encounter (HOSPITAL_COMMUNITY): Payer: Self-pay | Admitting: Obstetrics & Gynecology

## 2021-10-15 ENCOUNTER — Ambulatory Visit: Payer: Self-pay | Admitting: *Deleted

## 2021-10-15 ENCOUNTER — Ambulatory Visit: Payer: Self-pay | Attending: Internal Medicine

## 2021-10-15 ENCOUNTER — Ambulatory Visit: Payer: Self-pay

## 2021-10-15 ENCOUNTER — Other Ambulatory Visit: Payer: Self-pay

## 2021-10-15 ENCOUNTER — Inpatient Hospital Stay (HOSPITAL_COMMUNITY)
Admission: AD | Admit: 2021-10-15 | Discharge: 2021-10-15 | Disposition: A | Discharge status: Home / Self Care | Payer: Self-pay | Attending: Obstetrics & Gynecology | Admitting: Obstetrics & Gynecology

## 2021-10-15 ENCOUNTER — Telehealth: Payer: Self-pay

## 2021-10-15 ENCOUNTER — Inpatient Hospital Stay (HOSPITAL_COMMUNITY): Payer: Self-pay

## 2021-10-15 VITALS — BP 126/84 | Wt 220.5 lb

## 2021-10-15 DIAGNOSIS — O039 Complete or unspecified spontaneous abortion without complication: Secondary | ICD-10-CM

## 2021-10-15 DIAGNOSIS — Z6740 Type O blood, Rh positive: Secondary | ICD-10-CM | POA: Insufficient documentation

## 2021-10-15 DIAGNOSIS — O209 Hemorrhage in early pregnancy, unspecified: Secondary | ICD-10-CM | POA: Insufficient documentation

## 2021-10-15 DIAGNOSIS — O3680X0 Pregnancy with inconclusive fetal viability, not applicable or unspecified: Secondary | ICD-10-CM | POA: Insufficient documentation

## 2021-10-15 DIAGNOSIS — R109 Unspecified abdominal pain: Secondary | ICD-10-CM

## 2021-10-15 DIAGNOSIS — Z8759 Personal history of other complications of pregnancy, childbirth and the puerperium: Secondary | ICD-10-CM | POA: Insufficient documentation

## 2021-10-15 DIAGNOSIS — R102 Pelvic and perineal pain: Secondary | ICD-10-CM | POA: Insufficient documentation

## 2021-10-15 DIAGNOSIS — Z3A01 Less than 8 weeks gestation of pregnancy: Secondary | ICD-10-CM | POA: Insufficient documentation

## 2021-10-15 DIAGNOSIS — Z1239 Encounter for other screening for malignant neoplasm of breast: Secondary | ICD-10-CM

## 2021-10-15 DIAGNOSIS — N644 Mastodynia: Secondary | ICD-10-CM

## 2021-10-15 DIAGNOSIS — O26891 Other specified pregnancy related conditions, first trimester: Secondary | ICD-10-CM | POA: Insufficient documentation

## 2021-10-15 DIAGNOSIS — K219 Gastro-esophageal reflux disease without esophagitis: Secondary | ICD-10-CM | POA: Insufficient documentation

## 2021-10-15 DIAGNOSIS — Z3201 Encounter for pregnancy test, result positive: Secondary | ICD-10-CM

## 2021-10-15 DIAGNOSIS — O99611 Diseases of the digestive system complicating pregnancy, first trimester: Secondary | ICD-10-CM | POA: Insufficient documentation

## 2021-10-15 LAB — COMPREHENSIVE METABOLIC PANEL
ALT: 39 U/L (ref 0–44)
AST: 30 U/L (ref 15–41)
Albumin: 3.7 g/dL (ref 3.5–5.0)
Alkaline Phosphatase: 58 U/L (ref 38–126)
Anion gap: 10 (ref 5–15)
BUN: <5 mg/dL — ABNORMAL LOW (ref 6–20)
CO2: 22 mmol/L (ref 22–32)
Calcium: 9.1 mg/dL (ref 8.9–10.3)
Chloride: 107 mmol/L (ref 98–111)
Creatinine, Ser: 0.5 mg/dL (ref 0.44–1.00)
GFR, Estimated: >60 mL/min (ref >60)
Glucose, Bld: 108 mg/dL — ABNORMAL HIGH (ref 70–99)
Potassium: 3.7 mmol/L (ref 3.5–5.1)
Sodium: 139 mmol/L (ref 135–145)
Total Bilirubin: 0.5 mg/dL (ref 0.3–1.2)
Total Protein: 7.1 g/dL (ref 6.5–8.1)

## 2021-10-15 LAB — URINALYSIS, ROUTINE W REFLEX MICROSCOPIC
Glucose, UA: NEGATIVE mg/dL
Ketones, ur: 40 mg/dL — AB
Nitrite: NEGATIVE
Protein, ur: 100 mg/dL — AB
Specific Gravity, Urine: 1.02 (ref 1.005–1.030)
pH: 6.5 (ref 5.0–8.0)

## 2021-10-15 LAB — URINALYSIS, MICROSCOPIC (REFLEX): RBC / HPF: >50 RBC/hpf (ref 0–5)

## 2021-10-15 LAB — CBC
HCT: 39.2 % (ref 36.0–46.0)
Hemoglobin: 12.9 g/dL (ref 12.0–15.0)
MCH: 28.3 pg (ref 26.0–34.0)
MCHC: 32.9 g/dL (ref 30.0–36.0)
MCV: 86 fL (ref 80.0–100.0)
Platelets: 368 10*3/uL (ref 150–400)
RBC: 4.56 MIL/uL (ref 3.87–5.11)
RDW: 12.9 % (ref 11.5–15.5)
WBC: 9.8 10*3/uL (ref 4.0–10.5)
nRBC: 0 % (ref 0.0–0.2)

## 2021-10-15 LAB — ABO/RH: ABO/RH(D): O POS

## 2021-10-15 LAB — POCT PREGNANCY, URINE: Preg Test, Ur: POSITIVE — AB

## 2021-10-15 LAB — HCG, QUANTITATIVE, PREGNANCY: hCG, Beta Chain, Quant, S: 1209 m[IU]/mL — ABNORMAL HIGH (ref <5)

## 2021-10-15 NOTE — Progress Notes (Signed)
[name] is a 31 y.o. female who presents to [facility] clinic today with complaint of left outer breast pain the pain that radiates to nipple that comes and goes since prior to previous exam 04/03/2020. Patient rates the pain at a 8 out of 10.   Pap Smear: Pap smear not completed today. Last Pap smear was 12/03/2020 at the [facility] for [place] [facility] and normal. Patients previous Pap smear was 10/18/2018 and was normal with positive HPV. Per patient has no history of an abnormal Pap smear. Last Pap smear result is available in Epic.   Physical exam: Breasts Breasts symmetrical. No skin abnormalities bilateral breasts. No nipple retraction bilateral breasts. No nipple discharge bilateral breasts. No lymphadenopathy. No lumps palpated bilateral breasts. Complaints of left upper and lower breast pain on exam.     Pelvic/Bimanual Pap is not indicated today per BCCCP guidelines.   Smoking History: Patient has never smoked.   Patient Navigation: Patient education provided. Access to services provided for patient through BCCCP program.   Breast and Cervical Cancer Risk Assessment: Patient does not have family history of breast cancer, known genetic mutations, or radiation treatment to the chest before age 54. Patient does not have history of cervical dysplasia, immunocompromised, or DES exposure in-utero. Breast cancer risk assessment completed. No breast cancer risk calculated due to patient is less than 99 years old.  Risk Assessment     Risk Scores       10/15/2021 04/03/2020   Last edited by: Tara Mathews, Tara Mathews, Tara Mamie Nick, Tara   5-year risk:     Lifetime risk:              A: BCCCP exam without pap smear Complaint of left breast pain.  P: Referred patient to the [facility] for a left breast ultrasound. Appointment scheduled Thursday, October 15, 2021 at 1250.  Tara Parish, RN 10/15/2021 10:23 AM

## 2021-10-15 NOTE — MAU Note (Signed)
Tara Mathews is a 31 y.o. at Unknown here in MAU reporting: went to MD office for pregnancy confirmation.  Once there began having VB with wiping and was instructed to go to MAU for evaluation.  Pt alos c/o lower abdominal cramping. LMP: 09/04/2021 Onset of complaint: today Pain score: 7/10 Vitals:   10/15/21 1206  BP: 128/82  Pulse: (!) 116  Resp: 18  Temp: 98.4 F (36.9 C)  SpO2: 97%     FHT:N/A Lab orders placed from triage: UPT

## 2021-10-15 NOTE — Discharge Instructions (Signed)
You were seen in the MAU for abdominal pain and vaginal bleeding. We did an ultrasound that showed a very early pregnancy in your uterus. It is not clear if this is a normal pregnancy or the bleeding is a sign of miscarriage. We will coordinate with our outpatient clinic for a repeat pregnancy hormone level early next week and a follow up ultrasound in about two and a half weeks.  If you have heavy vaginal bleeding, severe abdominal pain, or fever, return to the MAU immediately.

## 2021-10-15 NOTE — Telephone Encounter (Addendum)
-----   Message from Clarnce Flock, MD sent at 10/15/2021  4:39 PM EDT ----- Regarding: Needs follow up hcg and viability [place] Patient seen in MAU for vaginal bleeding Has gestational sac on [place] only. HCG wasw ~1200 Needs repeat non-stat hcg on Monday or early sometime next week Also needs viability scan no sooner than 11/03/21. Viability scan can be cancelled if hcg from early next week is downtrending.   Thanks [facility] pt and left message that I am calling with follow up and appts please call the office.    Frances Nickels  10/15/21

## 2021-10-15 NOTE — Telephone Encounter (Signed)
Patient came in today for labs.  Patient tearful. States she had noticed some cramping and bleeding this morning.   Advised to rest and drink plenty of fluids. If pain increased to go to [facility]. Please advise otherwise.

## 2021-10-15 NOTE — Patient Instructions (Signed)
Explained breast self awareness with  Tara Mathews. Patient did not need a Pap smear today due to last Pap smear was 12/03/2020. Let her know BCCCP will cover Pap smears every 3 years unless has a history of abnormal Pap smears. Referred patient to the [facility] for a left breast ultrasound. Appointment scheduled Thursday, October 15, 2021 at 1250. Patient aware of appointment and will be there. Tara Mathews verbalized understanding.  Morrison Mcbryar, Arvil [place]aco, RN 10:23 AM

## 2021-10-15 NOTE — MAU Provider Note (Addendum)
History     [id number]  Arrival date and time: 10/15/21 1141    Chief Complaint  Patient presents with   Vaginal Bleeding     HPI Tara Mathews is a 31 y.o. G2P1 at 40w6dby LMP with PMHx notable for anxiety and GERD, who presents for vaginal bleeding and pelvic pain in the first trimester. Patient reports the pain and bleeding started this morning. She thought it was implantation bleeding at first, but is concerned about a miscarriage. Bleeding is more than spotting, passing a few small clots. Period was fairly regular prior to conceiving.   Last pregnancy was term SVD, induced for preeclampsia without severe features. Complications included shoulder dystocia for 32 seconds.    Vaginal bleeding: Yes LOF: No Fetal Movement: No Contractions: No  --/--/O POS Performed at [facility] [place]  [id number] ([phone])  OB History     Gravida  2   Para  1   Term  1   Preterm      AB      Living  1      SAB      IAB      Ectopic      Multiple  0   Live Births  1           Past Medical History:  Diagnosis Date   Anxiety    GERD (gastroesophageal reflux disease)     Past Surgical History:  Procedure Laterality Date   EYE SURGERY Bilateral    March 2023   GALLBLADDER SURGERY     LAPAROTOMY N/A 06/17/2020   Procedure: LAPAROTOMY;  Surgeon: BGriffin Basil MD;  [facility] [facility]  Service: Gynecology;  Laterality: N/A;   OVARIAN CYST REMOVAL Left 06/17/2020   Procedure: OPEN OVARIAN CYSTECTOMY;  Surgeon: BGriffin Basil MD;  [place] [facility]  Service: Gynecology;  Laterality: Left;    Family History  Problem Relation Age of Onset   Obesity Mother    Healthy Mother    Arthritis Father    Hypertension Father    Obesity Father    Miscarriages / Stillbirths Maternal Aunt    Cancer Maternal Uncle        Brain tumor   Diabetes Paternal Grandmother     Social History   Socioeconomic History   Marital  status: Single    Spouse name: Not on file   Number of children: 1   Years of education: Not on file   Highest education level: Associate degree: occupational, tHotel manager or vocational program  Occupational History   Occupation: UNEMPLOYED  Tobacco Use   Smoking status: Never   Smokeless tobacco: Never  Vaping Use   Vaping Use: Never used  Substance and Sexual Activity   Alcohol use: No   Drug use: No   Sexual activity: Yes    Birth control/protection: None    Comment: 1st intercourse 31 yo-Fewer than 5 partners  Other Topics Concern   Not on file  Social History Narrative   Not on file   Social Determinants of Health   Financial Resource Strain: Not on file  Food Insecurity: No Food Insecurity (10/15/2021)   Hunger Vital Sign    Worried About Running Out of Food in the Last Year: Never true    Ran Out of Food in the Last Year: Never true  Transportation Needs: No Transportation Needs (10/15/2021)   PRAPARE - THydrologist(Medical):  No    Lack of Transportation (Non-Medical): No  Physical Activity: Insufficiently Active (10/18/2018)   Exercise Vital Sign    Days of Exercise per Week: 2 days    Minutes of Exercise per Session: 40 min  Stress: No Stress Concern Present (10/18/2018)   [place]    Feeling of Stress : Only a little  Social Connections: Somewhat Isolated (10/18/2018)   Social Connection and Isolation Panel [NHANES]    Frequency of Communication with Friends and Family: More than three times a week    Frequency of Social Gatherings with Friends and Family: More than three times a week    Attends Religious Services: 1 to 4 times per year    Active Member of [employer] or Organizations: No    Attends Archivist Meetings: Never    Marital Status: Never married  Intimate Partner Violence: Not At Risk (10/18/2018)   Humiliation, Afraid, Rape, and Kick questionnaire    Fear  of Current or Ex-Partner: No    Emotionally Abused: No    Physically Abused: No    Sexually Abused: No    Allergies  Allergen Reactions   Bactrim [Sulfamethoxazole-Trimethoprim] Hives    No current facility-administered medications on file prior to encounter.   Current Outpatient Medications on File Prior to Encounter  Medication Sig Dispense Refill   Prenatal MV & Min w/FA-DHA (PRENATAL GUMMIES PO) Take 2 each by mouth 2 (two) times daily.     cephALEXin (KEFLEX) 500 MG capsule Take 1 capsule (500 mg total) by mouth 4 (four) times daily. 20 capsule 0   ELDERBERRY PO Take 1 capsule by mouth as needed.     metroNIDAZOLE (FLAGYL) 500 MG tablet Take 1 tablet (500 mg total) by mouth 2 (two) times daily. 14 tablet 0   Polyvinyl Alcohol-Povidone (REFRESH OP) Place 1 drop into both eyes daily as needed (dry eyes).       Review of Systems  Constitutional: Negative.   Respiratory: Negative.    Cardiovascular: Negative.   Gastrointestinal:  Positive for abdominal pain.  Genitourinary:        Vaginal bleeding     Physical Exam   BP 128/82 (BP Location: Right Arm)   Pulse (!) 116   Temp 98.4 F (36.9 C) (Oral)   Resp 18   Ht 5' (1.524 m)   Wt 99.9 kg   LMP 09/04/2021 (Exact Date)   SpO2 97%   BMI 43.00 kg/m   Patient Vitals for the past 24 hrs:  BP Temp Temp src Pulse Resp SpO2 Height Weight  10/15/21 1206 128/82 98.4 F (36.9 C) Oral (!) 116 18 97 % -- --  10/15/21 1200 -- -- -- -- -- -- 5' (1.524 m) 99.9 kg    Physical Exam Constitutional:      Appearance: Normal appearance.  HENT:     Head: Normocephalic and atraumatic.  Cardiovascular:     Rate and Rhythm: Regular rhythm. Tachycardia present.  Pulmonary:     Effort: Pulmonary effort is normal.     Breath sounds: Normal breath sounds.  Abdominal:     Palpations: Abdomen is soft.  Skin:    General: Skin is warm and dry.  Neurological:     Mental Status: She is alert.     Bedside Ultrasound  Documentation Pt informed that the ultrasound is considered a limited OB ultrasound and is not intended to be a complete ultrasound exam.  Patient also informed that  the ultrasound is not being completed with the intent of assessing for fetal or placental anomalies or any pelvic abnormalities.  Explained that the purpose of today's ultrasound is to assess for  viability.  Patient acknowledges the purpose of the exam and the limitations of the study.    My interpretation: poor visualization due to overlying bowel, no clear evidence of IUP seen    Labs Results for orders placed or performed during the hospital encounter of 10/15/21 (from the past 24 hour(s))  Pregnancy, urine POC     Status: Abnormal   Collection Time: 10/15/21 11:57 AM  Result Value Ref Range   Preg Test, Ur POSITIVE (A) NEGATIVE  Urinalysis, Routine w reflex microscopic Urine, Clean Catch     Status: Abnormal   Collection Time: 10/15/21 12:25 PM  Result Value Ref Range   Color, Urine RED (A) YELLOW   APPearance CLOUDY (A) CLEAR   Specific Gravity, Urine 1.020 1.005 - 1.030   pH 6.5 5.0 - 8.0   Glucose, UA NEGATIVE NEGATIVE mg/dL   Hgb urine dipstick LARGE (A) NEGATIVE   Bilirubin Urine SMALL (A) NEGATIVE   Ketones, ur 40 (A) NEGATIVE mg/dL   Protein, ur 100 (A) NEGATIVE mg/dL   Nitrite NEGATIVE NEGATIVE   Leukocytes,Ua TRACE (A) NEGATIVE  Urinalysis, Microscopic (reflex)     Status: Abnormal   Collection Time: 10/15/21 12:25 PM  Result Value Ref Range   RBC / HPF >50 0 - 5 RBC/hpf   WBC, UA 6-10 0 - 5 WBC/hpf   Bacteria, UA FEW (A) NONE SEEN   Squamous Epithelial / LPF 21-50 0 - 5  CBC     Status: None   Collection Time: 10/15/21  2:11 PM  Result Value Ref Range   WBC 9.8 4.0 - 10.5 K/uL   RBC 4.56 3.87 - 5.11 MIL/uL   Hemoglobin 12.9 12.0 - 15.0 g/dL   HCT 39.2 36.0 - 46.0 %   MCV 86.0 80.0 - 100.0 fL   MCH 28.3 26.0 - 34.0 pg   MCHC 32.9 30.0 - 36.0 g/dL   RDW 12.9 11.5 - 15.5 %   Platelets 368 150 -  400 K/uL   nRBC 0.0 0.0 - 0.2 %  Comprehensive metabolic panel     Status: Abnormal   Collection Time: 10/15/21  2:11 PM  Result Value Ref Range   Sodium 139 135 - 145 mmol/L   Potassium 3.7 3.5 - 5.1 mmol/L   Chloride 107 98 - 111 mmol/L   CO2 22 22 - 32 mmol/L   Glucose, Bld 108 (H) 70 - 99 mg/dL   BUN <5 (L) 6 - 20 mg/dL   Creatinine, Ser 0.50 0.44 - 1.00 mg/dL   Calcium 9.1 8.9 - 10.3 mg/dL   Total Protein 7.1 6.5 - 8.1 g/dL   Albumin 3.7 3.5 - 5.0 g/dL   AST 30 15 - 41 U/L   ALT 39 0 - 44 U/L   Alkaline Phosphatase 58 38 - 126 U/L   Total Bilirubin 0.5 0.3 - 1.2 mg/dL   GFR, Estimated >60 >60 mL/min   Anion gap 10 5 - 15  hCG, quantitative, pregnancy     Status: Abnormal   Collection Time: 10/15/21  2:11 PM  Result Value Ref Range   hCG, Beta Chain, Quant, S 1,209 (H) <5 mIU/mL  ABO/Rh     Status: None   Collection Time: 10/15/21  2:11 PM  Result Value Ref Range   ABO/RH(D)  O POS Performed at [facility], [place] [address], [place], [place] [address]     Imaging US OB LESS THAN 14 WEEKS WITH [place] TRANSVAGINAL  Result Date: 10/15/2021 CLINICAL DATA:  Pelvic pain for 1 day. EXAM: OBSTETRIC <14 WK [place] AND TRANSVAGINAL OB US TECHNIQUE: Both transabdominal and transvaginal ultrasound examinations were performed for complete evaluation of the gestation as well as the maternal uterus, adnexal regions, and pelvic cul-de-sac. Transvaginal technique was performed to assess early pregnancy. COMPARISON:  None Available. FINDINGS: Intrauterine gestational sac: Single small. Yolk sac:  None Embryo:  None Cardiac Activity: N/A Heart Rate: N/A bpm MSD: 4.9 mm   5 w   2 d Subchorionic hemorrhage:  Moderate Maternal uterus/adnexae: Both ovaries are normal. No free pelvic fluid collections. IMPRESSION: Probable early intrauterine gestational sac, but no yolk sac, fetal pole, or cardiac activity yet visualized. Recommend follow-up quantitative B-HCG levels and follow-up US in 14 days to  assess viability. This recommendation follows SRU consensus guidelines: Diagnostic Criteria for Nonviable Pregnancy Early in the First Trimester. [place] Med 2013; 193:7902-40. Electronically Signed   By: Marijo Sanes M.D.   On: 10/15/2021 16:28    MAU Course  Procedures Lab Orders         Urinalysis, Routine w reflex microscopic Urine, Clean Catch         Urinalysis, Microscopic (reflex)         CBC         Comprehensive metabolic panel         hCG, quantitative, pregnancy         Pregnancy, urine POC    No orders of the defined types were placed in this encounter.  Imaging Orders         US OB LESS THAN 14 WEEKS WITH OB TRANSVAGINAL     MDM Moderate  Assessment and Plan  #Pelvic pain and vaginal bleeding in first trimester #[redacted] weeks gestation of pregnancy #Pregnancy of unknown location  -transvaginal US showed gestational sac with bleeding  -counseled patient that viability is uncertain at present, will repeat quantitative B-hCG outpatient early next week and repeat ultrasound in two weeks to determine if pregnancy is healthy. Answered all questions. Message sent to clinic to schedule follow up visits. Return precautions given in verbal and written form. Blood type O+, rhogam not indicated.    Dispo: discharged to home in stable condition.     Marisa Cyphers, Medical Student 10/15/21 4:43 PM  ATTENDING ATTESTATION  I have seen and examined this patient and agree with the above documentation in the medical student's note except as below.  I have edited the above note for accuracy and clarity  Clarnce Flock, MD/[facility] (Faculty Practice) 10/15/2021, 4:48 PM    Allergies as of 10/15/2021       Reactions   Bactrim [sulfamethoxazole-trimethoprim] Hives        Medication List     STOP taking these medications    cephALEXin 500 MG capsule Commonly known as: KEFLEX   ELDERBERRY PO   metroNIDAZOLE 500 MG tablet Commonly known as:  FLAGYL       TAKE these medications    PRENATAL GUMMIES PO Take 2 each by mouth 2 (two) times daily.   REFRESH OP Place 1 drop into both eyes daily as needed (dry eyes).

## 2021-10-16 ENCOUNTER — Encounter: Payer: Self-pay | Admitting: Nurse Practitioner

## 2021-10-16 LAB — BETA HCG QUANT (REF LAB): hCG Quant: 1204 m[IU]/mL

## 2021-10-19 ENCOUNTER — Telehealth: Payer: Self-pay | Admitting: Family Medicine

## 2021-10-19 ENCOUNTER — Other Ambulatory Visit: Payer: Self-pay

## 2021-10-19 DIAGNOSIS — O3680X0 Pregnancy with inconclusive fetal viability, not applicable or unspecified: Secondary | ICD-10-CM

## 2021-10-19 NOTE — Telephone Encounter (Signed)
Patient called in wanting to schedule lab work, once I explained to the patient that Merla Riches will do her follow up care from her MAU visit she stated she does not wish to go there anymore and would like to transfer care to [place].

## 2021-10-20 ENCOUNTER — Other Ambulatory Visit: Payer: Self-pay

## 2021-10-20 DIAGNOSIS — O3680X0 Pregnancy with inconclusive fetal viability, not applicable or unspecified: Secondary | ICD-10-CM

## 2021-10-21 LAB — BETA HCG QUANT (REF LAB): hCG Quant: 77 m[IU]/mL

## 2021-10-21 NOTE — Telephone Encounter (Addendum)
Called pt and pt states that she has a miscarriage as her levels she saw on MyChart dropped to a 77.  I explained to the pt that it does look like she has and that I was sorry for her loss.  Pt reports scant vaginal bleeding and mild menstrual like cramping.  Pt also reports that she has been having a sharp shooting pain that radiates down her leg on her right side and mild swelling.  Pt reports that she does have sciatica.  I explained to the pt that the sharp shooting pain she is describing sounds related to her sciatica and that I would recommend that she f/u with her PCP.  Pt informed that she can come in one week for non stat beta and in two weeks for provider visit.  Pt given reasons why she needs to go to MAU and to call the office with concerns.  Pt states that she will just wait on the provider in two weeks.    Frances Nickels

## 2021-10-21 NOTE — Addendum Note (Signed)
Addended by: Michel Harrow on: 10/21/2021 05:06 PM   Modules accepted: Orders

## 2021-10-28 ENCOUNTER — Other Ambulatory Visit: Payer: Self-pay

## 2021-10-28 DIAGNOSIS — O039 Complete or unspecified spontaneous abortion without complication: Secondary | ICD-10-CM

## 2021-10-29 LAB — BETA HCG QUANT (REF LAB): hCG Quant: <1 m[IU]/mL

## 2021-11-05 ENCOUNTER — Encounter: Payer: Self-pay | Admitting: Obstetrics and Gynecology

## 2021-11-05 ENCOUNTER — Other Ambulatory Visit (HOSPITAL_COMMUNITY)
Admission: RE | Admit: 2021-11-05 | Discharge: 2021-11-05 | Disposition: A | Discharge status: Home / Self Care | Payer: Self-pay | Source: Ambulatory Visit | Attending: Obstetrics and Gynecology | Admitting: Obstetrics and Gynecology

## 2021-11-05 ENCOUNTER — Other Ambulatory Visit: Payer: Self-pay

## 2021-11-05 ENCOUNTER — Ambulatory Visit (INDEPENDENT_AMBULATORY_CARE_PROVIDER_SITE_OTHER): Payer: Self-pay | Admitting: Obstetrics and Gynecology

## 2021-11-05 DIAGNOSIS — N9089 Other specified noninflammatory disorders of vulva and perineum: Secondary | ICD-10-CM

## 2021-11-05 DIAGNOSIS — O039 Complete or unspecified spontaneous abortion without complication: Secondary | ICD-10-CM

## 2021-11-05 NOTE — Progress Notes (Signed)
Patient stated that she is a little sad. I explained and offered  BH to her and she stated that she will be fine.   She explained to me that she does have a "skin tag" near her vulva that bother her sometimes. She wants to know if she could get it frozen off

## 2021-11-06 DIAGNOSIS — O039 Complete or unspecified spontaneous abortion without complication: Secondary | ICD-10-CM | POA: Insufficient documentation

## 2021-11-06 NOTE — Progress Notes (Signed)
  CC: SAB follow up, skin tag Subjective:    Patient ID: Tara Mathews, female    DOB: May 31, 1990, 31 y.o.   MRN: [id number]  HPI Pt seen for follow up of SAB.  Bleeding has ceased and bhcg has decreased to normal.  Pt also requests removal of skin tag on her mons pubis.   Review of Systems     Objective:   Physical Exam Vitals:   11/05/21 1131  BP: 130/84  Pulse: 73   Procedure:  Skin cleaned x 2 with alcohol swab Local anesthetic placed at base of skin tag. [place] cleaned x 2 with betadine Skin tag grasped with tweezers and base snipped with scissors. Silver nitrate used for hemostasis Band aid applied Pt tolerated procedure well, sample sent to path     Assessment & Plan:   1. Skin tag of female perineum Specimen sent to path - Surgical pathology( [place]/ [facility])  2. SAB (spontaneous abortion) Bchg WNL, no further follow up recommended    Griffin Basil, MD Faculty Attending, [facility]

## 2021-11-09 ENCOUNTER — Encounter: Payer: Self-pay | Admitting: General Practice

## 2021-11-09 LAB — SURGICAL PATHOLOGY

## 2021-11-19 ENCOUNTER — Encounter: Payer: Self-pay | Admitting: Obstetrics and Gynecology

## 2021-12-18 IMAGING — US US BREAST*L* LIMITED INC AXILLA
1 series · 4 of 4 positions shown · non-contrast
Comparison: Previous exam(s).

CLINICAL DATA: 29-year-old female with a new lump and separate area
of focal pain in the left breast. Patient has recently stopped
breast feeding.

EXAM:
ULTRASOUND OF THE LEFT BREAST

[Series 1: us breast*left* limited inc axilla · 0.07mm/px · 4 of 4 slices shown]
[im 1/4]
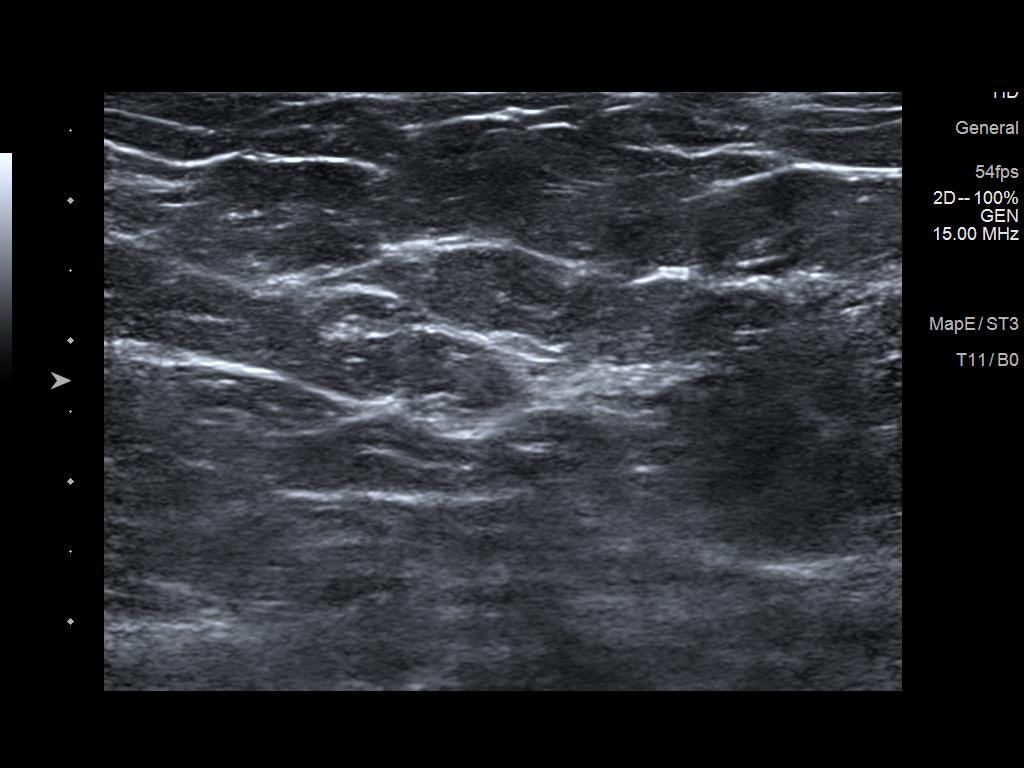
[im 2/4]
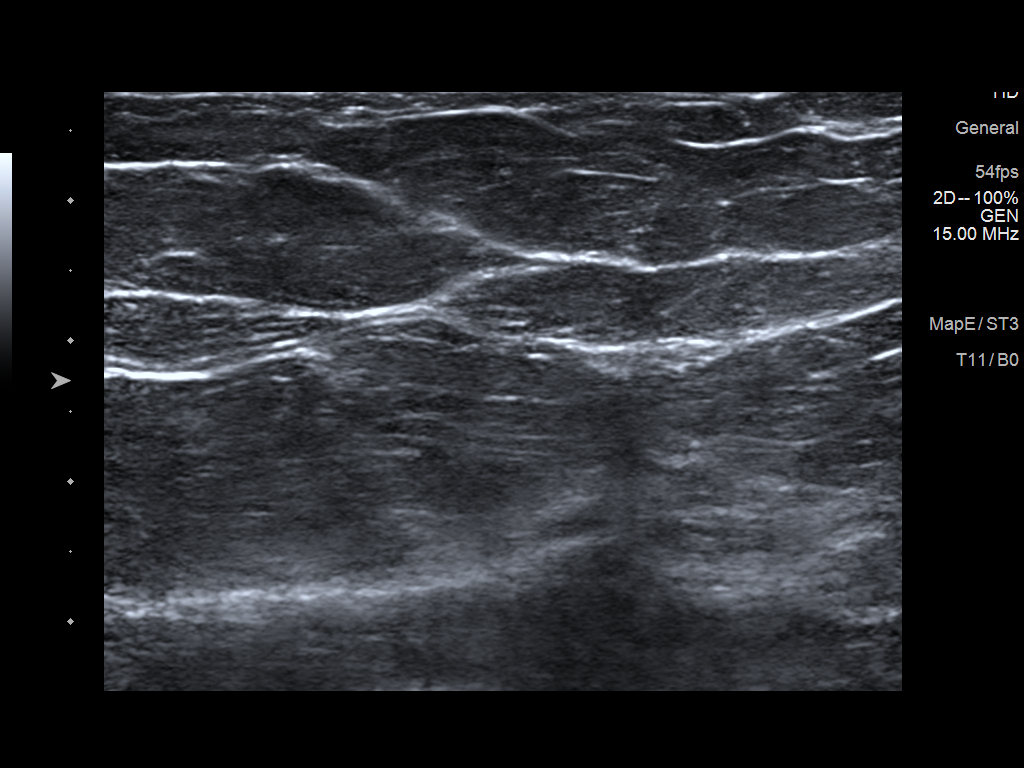
[im 3/4]
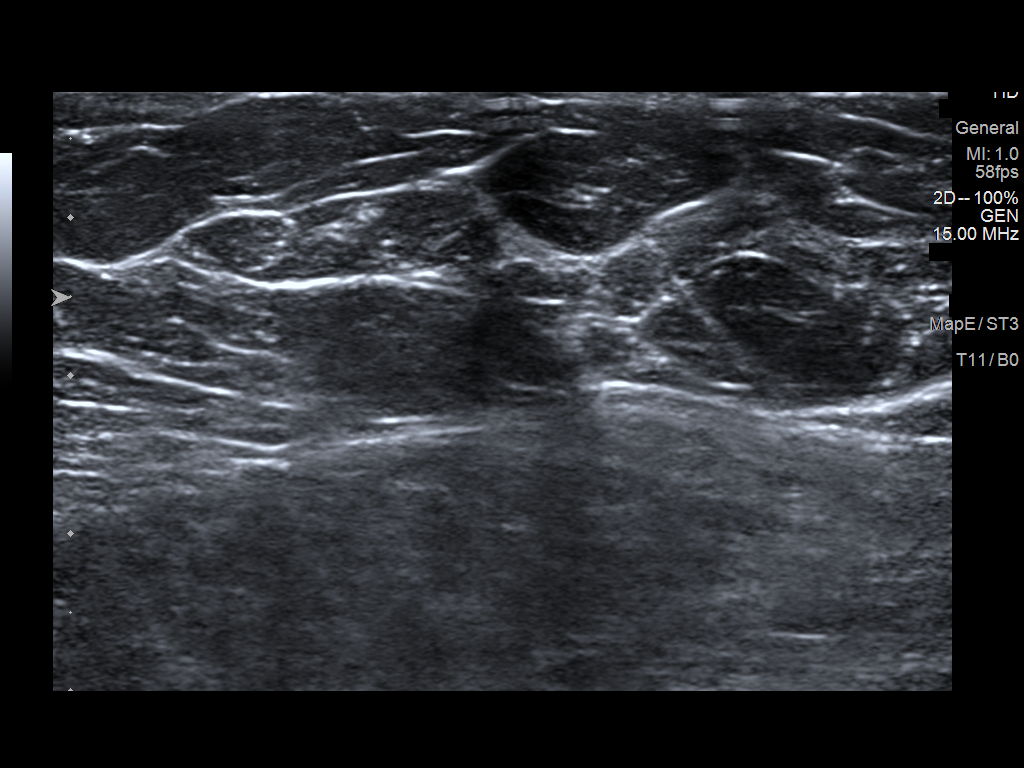
[im 4/4]
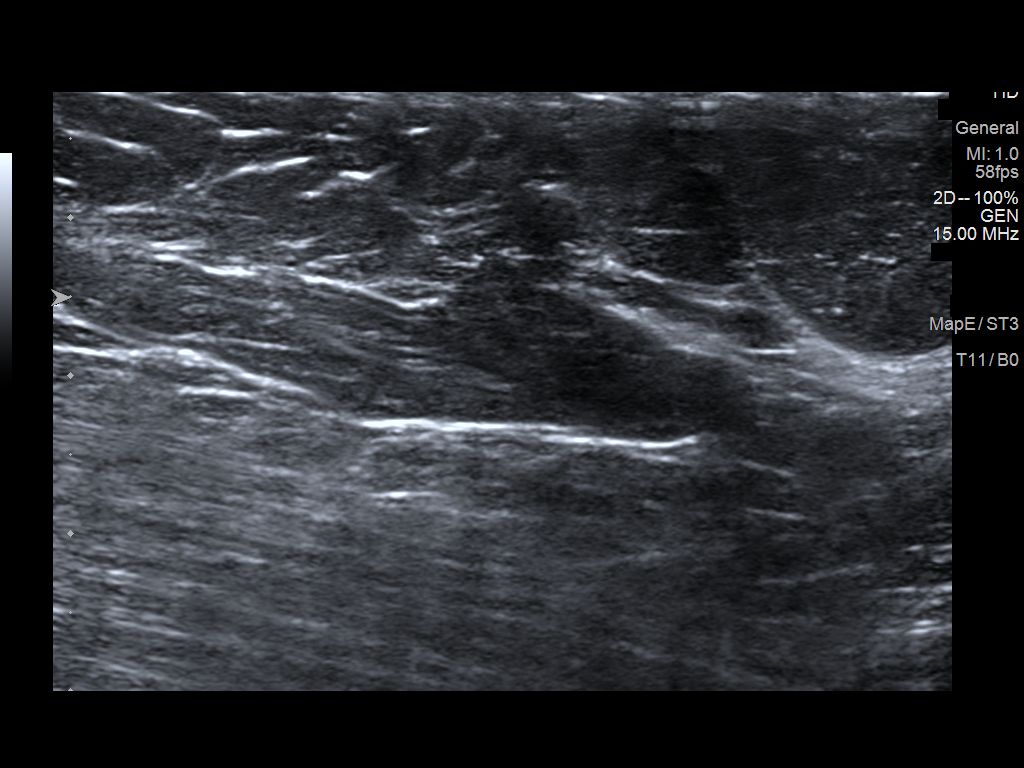

[4 of 4 positions shown; findings below may reference images not displayed]

FINDINGS: On physical exam, I do not feel a discrete mass at the site of
concern reported by the patient in the medial aspect of the right
breast or an abnormality in the low axilla at the site of pain
reported by the patient.

Targeted ultrasound is performed in the left breast at 9 o'clock 12
cm from nipple at the palpable area of concern reported by the
patient demonstrating no cystic or solid mass. No dilated duct or
focal fluid collection.

Targeted ultrasound is performed in the left axilla at the site of
focal pain reported by the patient demonstrating no cystic or solid
mass or other abnormality to explain the patient's pain.
IMPRESSION: No sonographic evidence of malignancy or other abnormality at the
palpable/painful sites of concern reported by the patient in the
left breast.

RECOMMENDATION:
1. Recommend any further workup of the left palpable area and pain
be on a clinical basis.

2.  Begin routine annual screening mammography at age 41.

I have discussed the findings and recommendations with the patient.
If applicable, a reminder letter will be sent to the patient
regarding the next appointment.

BI-RADS CATEGORY  1: Negative.

## 2022-01-22 ENCOUNTER — Ambulatory Visit: Payer: Self-pay | Admitting: Nurse Practitioner

## 2022-03-08 ENCOUNTER — Other Ambulatory Visit: Payer: Self-pay | Admitting: Obstetrics and Gynecology

## 2022-03-08 DIAGNOSIS — N6452 Nipple discharge: Secondary | ICD-10-CM

## 2022-03-11 ENCOUNTER — Other Ambulatory Visit: Payer: Self-pay | Admitting: Obstetrics and Gynecology

## 2022-03-11 ENCOUNTER — Ambulatory Visit
Admission: RE | Admit: 2022-03-11 | Discharge: 2022-03-11 | Disposition: A | Discharge status: Home / Self Care | Payer: No Typology Code available for payment source | Source: Ambulatory Visit | Attending: Obstetrics and Gynecology | Admitting: Obstetrics and Gynecology

## 2022-03-11 ENCOUNTER — Ambulatory Visit
Admission: RE | Admit: 2022-03-11 | Discharge: 2022-03-11 | Disposition: A | Discharge status: Home / Self Care | Payer: Self-pay | Source: Ambulatory Visit | Attending: Obstetrics and Gynecology | Admitting: Obstetrics and Gynecology

## 2022-03-11 DIAGNOSIS — N6452 Nipple discharge: Secondary | ICD-10-CM

## 2022-03-11 DIAGNOSIS — N6489 Other specified disorders of breast: Secondary | ICD-10-CM

## 2022-03-15 ENCOUNTER — Ambulatory Visit: Payer: Self-pay | Admitting: Nurse Practitioner

## 2022-04-09 ENCOUNTER — Ambulatory Visit
Admission: EM | Admit: 2022-04-09 | Discharge: 2022-04-09 | Disposition: A | Discharge status: Home / Self Care | Payer: Self-pay | Attending: Emergency Medicine | Admitting: Emergency Medicine

## 2022-04-09 DIAGNOSIS — H109 Unspecified conjunctivitis: Secondary | ICD-10-CM

## 2022-04-09 DIAGNOSIS — B9689 Other specified bacterial agents as the cause of diseases classified elsewhere: Secondary | ICD-10-CM

## 2022-04-09 MED ORDER — CIPROFLOXACIN HCL 0.3 % OP SOLN
OPHTHALMIC | 0 refills | Status: DC
Start: 2022-04-09 — End: 2023-03-24

## 2022-04-09 NOTE — Discharge Instructions (Signed)
Please begin ciprofloxacin eyedrops for bacterial conjunctivitis.  Ciloxan (ciprofloxacin): Please instill 1 drop into the affected eye(s) every 2 hours while awake for days 1 and 2 then instill 1 drop into the affected eye(s) every 4 hours while awake for days 3 through 7.  Please be sure that you finish the full 7-day treatment.  Please follow-up if you do not have relief of your symptoms after 7 days of treatment.  Thank you for visiting urgent care today.

## 2022-04-09 NOTE — ED Provider Notes (Incomplete)
[facility]    CSN: [id number] Arrival date & time: 04/09/22  1643    HISTORY   Chief Complaint  Patient presents with   Eye irritation   HPI Tara Mathews is a pleasant, 31 y.o. female who presents to urgent care today. Patient complains of irritation of both of her eyes that began yesterday.  Patient states that her son has bacterial conjunctivitis, his symptoms began the day before her stent.  Patient states her right eye is worse than her left.  Patient states she has been using refresh eyedrops without meaningful relief of her symptoms.  Denies eye pain, vision changes.  The history is provided by the patient.   Past Medical History:  Diagnosis Date   Anxiety    GERD (gastroesophageal reflux disease)    Patient Active Problem List   Diagnosis Date Noted   SAB (spontaneous abortion) 11/06/2021   Skin tag of female perineum 11/05/2021   Postoperative visit 07/23/2020   Postoperative complication of skin involving drainage from surgical wound 07/02/2020   Encounter for post surgical wound check 06/24/2020   Dermoid cyst of ovary 06/17/2020   Dermoid cyst of ovary, left 03/12/2020   Breast mass in female 03/12/2020   Galactorrhea in female 03/12/2020   Shoulder dystocia during labor and delivery 05/08/2019   Labor and delivery, indication for care 05/07/2019   Preeclampsia 05/07/2019   Positive GBS test 05/02/2019   Obesity affecting pregnancy, antepartum 10/18/2018   Encounter for supervision of normal pregnancy 10/17/2018   Past Surgical History:  Procedure Laterality Date   EYE SURGERY Bilateral    March 2023   GALLBLADDER SURGERY     LAPAROTOMY N/A 06/17/2020   Procedure: LAPAROTOMY;  Surgeon: Griffin Basil, MD;  Location: [place];  Service: Gynecology;  Laterality: N/A;   OVARIAN CYST REMOVAL Left 06/17/2020   Procedure: OPEN OVARIAN CYSTECTOMY;  Surgeon: Griffin Basil, MD;  Location: [place];  Service: Gynecology;  Laterality: Left;    OB History     Gravida  2   Para  1   Term  1   Preterm      AB      Living  1      SAB      IAB      Ectopic      Multiple  0   Live Births  1          Home Medications    Prior to Admission medications   Not on File    Family History Family History  Problem Relation Age of Onset   Obesity Mother    Healthy Mother    Arthritis Father    Hypertension Father    Obesity Father    Miscarriages / Stillbirths Maternal Aunt    Leukemia Maternal Aunt    Brain cancer Maternal Uncle    Diabetes Paternal Grandmother    Brain cancer Cousin    Social History Social History   Tobacco Use   Smoking status: Never   Smokeless tobacco: Never  Vaping Use   Vaping Use: Never used  Substance Use Topics   Alcohol use: No   Drug use: No   Allergies   Bactrim [sulfamethoxazole-trimethoprim]  Review of Systems Review of Systems Pertinent findings revealed after performing a 14 point review of systems has been noted in the history of present illness.  Physical Exam Triage Vital Signs ED Triage Vitals  Enc Vitals Group     BP 02/06/21  0827 (!) 147/82     Pulse Rate 02/06/21 0827 72     Resp 02/06/21 0827 18     Temp 02/06/21 0827 98.3 F (36.8 C)     Temp Source 02/06/21 0827 Oral     SpO2 02/06/21 0827 98 %     Weight --      Height --      Head Circumference --      Peak Flow --      Pain Score 02/06/21 0826 5     Pain Loc --      Pain Edu? --      Excl. in [place]? --   No data found.  Updated Vital Signs BP 126/81 (BP Location: Left Arm)   Pulse 100   Temp 97.8 F (36.6 C) (Oral)   Resp 16   LMP 04/09/2022 (Exact Date)   SpO2 96%   Breastfeeding No   Physical Exam Vitals and nursing note reviewed.  Constitutional:      General: She is not in acute distress.    Appearance: Normal appearance.  HENT:     Head: Normocephalic and atraumatic.  Eyes:     General: Lids are normal. Lids are everted, no foreign bodies appreciated. Vision  grossly intact. Gaze aligned appropriately. No allergic shiner, visual field deficit or scleral icterus.       Right eye: No foreign body, discharge or hordeolum.        Left eye: No foreign body, discharge or hordeolum.     Extraocular Movements: Extraocular movements intact.     Conjunctiva/sclera:     Right eye: Right conjunctiva is not injected. Exudate present. No chemosis or hemorrhage.    Left eye: Left conjunctiva is not injected. No chemosis, exudate or hemorrhage.    Pupils: Pupils are equal, round, and reactive to light.  Cardiovascular:     Rate and Rhythm: Normal rate and regular rhythm.  Pulmonary:     Effort: Pulmonary effort is normal.     Breath sounds: Normal breath sounds.  Musculoskeletal:        General: Normal range of motion.     Cervical back: Normal range of motion and neck supple.  Skin:    General: Skin is warm and dry.  Neurological:     General: No focal deficit present.     Mental Status: She is alert and oriented to person, place, and time. Mental status is at baseline.  Psychiatric:        Mood and Affect: Mood normal.        Behavior: Behavior normal.        Thought Content: Thought content normal.        Judgment: Judgment normal.     Visual Acuity Right Eye Distance:   Left Eye Distance:   Bilateral Distance:    Right Eye Near:   Left Eye Near:    Bilateral Near:     UC Couse / Diagnostics / Procedures:     Radiology No results found.  Procedures Procedures (including critical care time) EKG  Pending results:  Labs Reviewed - No data to display  Medications Ordered in UC: Medications - No data to display  UC Diagnoses / Final Clinical Impressions(s)   I have reviewed the triage vital signs and the nursing notes.  Pertinent labs & imaging results that were available during my care of the patient were reviewed by me and considered in my medical decision making (see chart for details).  Final diagnoses:  Bacterial  conjunctivitis of both eyes   ***  Please see discharge instructions below for details of plan of care as provided to patient. ED Prescriptions     Medication Sig Dispense Auth. Provider   ciprofloxacin (CILOXAN) 0.3 % ophthalmic solution Administer 1 drop, every 2 hours, while awake, for 2 days. Then 1 drop, every 4 hours, while awake, for the next 5 days. 5 mL Lynden Oxford Scales, PA-C      PDMP not reviewed this encounter.  Pending results:  Labs Reviewed - No data to display  Discharge Instructions:   Discharge Instructions      Please begin ciprofloxacin eyedrops for bacterial conjunctivitis.  Ciloxan (ciprofloxacin): Please instill 1 drop into the affected eye(s) every 2 hours while awake for days 1 and 2 then instill 1 drop into the affected eye(s) every 4 hours while awake for days 3 through 7.  Please be sure that you finish the full 7-day treatment.  Please follow-up if you do not have relief of your symptoms after 7 days of treatment.  Thank you for visiting urgent care today.       Disposition Upon Discharge:  Condition: stable for discharge home  Patient presented with an acute illness with associated systemic symptoms and significant discomfort requiring urgent management. In my opinion, this is a condition that a prudent lay person (someone who possesses an average knowledge of health and medicine) may potentially expect to result in complications if not addressed urgently such as respiratory distress, impairment of bodily function or dysfunction of bodily organs.   Routine symptom specific, illness specific and/or disease specific instructions were discussed with the patient and/or caregiver at length.   As such, the patient has been evaluated and assessed, work-up was performed and treatment was provided in alignment with urgent care protocols and evidence based medicine.  Patient/parent/caregiver has been advised that the patient may require follow up  for further testing and treatment if the symptoms continue in spite of treatment, as clinically indicated and appropriate.  Patient/parent/caregiver has been advised to return to the [facility] or PCP if no better; to PCP or the Emergency Department if new signs and symptoms develop, or if the current signs or symptoms continue to change or worsen for further workup, evaluation and treatment as clinically indicated and appropriate  The patient will follow up with their current PCP if and as advised. If the patient does not currently have a PCP we will assist them in obtaining one.   The patient may need specialty follow up if the symptoms continue, in spite of conservative treatment and management, for further workup, evaluation, consultation and treatment as clinically indicated and appropriate.  Patient/parent/caregiver verbalized understanding and agreement of plan as discussed.  All questions were addressed during visit.  Please see discharge instructions below for further details of plan.  This office note has been dictated using Museum/gallery curator.  Unfortunately, this method of dictation can sometimes lead to typographical or grammatical errors.  I apologize for your inconvenience in advance if this occurs.  Please do not hesitate to reach out to me if clarification is needed.

## 2022-04-09 NOTE — ED Triage Notes (Signed)
Pt c/o bilateral eye irritation (right worse than left).  Started: Thursday   Home interventions: REFRESH eye drops

## 2022-04-13 ENCOUNTER — Emergency Department (HOSPITAL_BASED_OUTPATIENT_CLINIC_OR_DEPARTMENT_OTHER): Payer: Self-pay

## 2022-04-13 ENCOUNTER — Other Ambulatory Visit: Payer: Self-pay

## 2022-04-13 ENCOUNTER — Emergency Department (HOSPITAL_BASED_OUTPATIENT_CLINIC_OR_DEPARTMENT_OTHER)
Admission: EM | Admit: 2022-04-13 | Discharge: 2022-04-13 | Disposition: A | Discharge status: Home / Self Care | Payer: Self-pay | Attending: Student | Admitting: Student

## 2022-04-13 ENCOUNTER — Encounter (HOSPITAL_BASED_OUTPATIENT_CLINIC_OR_DEPARTMENT_OTHER): Payer: Self-pay | Admitting: Emergency Medicine

## 2022-04-13 DIAGNOSIS — G44209 Tension-type headache, unspecified, not intractable: Secondary | ICD-10-CM

## 2022-04-13 DIAGNOSIS — R519 Headache, unspecified: Secondary | ICD-10-CM | POA: Insufficient documentation

## 2022-04-13 LAB — BASIC METABOLIC PANEL
Anion gap: 10 (ref 5–15)
BUN: 11 mg/dL (ref 6–20)
CO2: 21 mmol/L — ABNORMAL LOW (ref 22–32)
Calcium: 9 mg/dL (ref 8.9–10.3)
Chloride: 105 mmol/L (ref 98–111)
Creatinine, Ser: 0.61 mg/dL (ref 0.44–1.00)
GFR, Estimated: >60 mL/min (ref >60)
Glucose, Bld: 130 mg/dL — ABNORMAL HIGH (ref 70–99)
Potassium: 3.6 mmol/L (ref 3.5–5.1)
Sodium: 136 mmol/L (ref 135–145)

## 2022-04-13 MED ORDER — LIDOCAINE 5 % EX PTCH
1.0000 | MEDICATED_PATCH | CUTANEOUS | 0 refills | Status: DC
Start: 2022-04-13 — End: 2023-03-24

## 2022-04-13 MED ORDER — PROCHLORPERAZINE EDISYLATE 10 MG/2ML IJ SOLN
10.0000 mg | Freq: Once | INTRAMUSCULAR | Status: AC
  Administered 2022-04-13: 10 mg via INTRAVENOUS
  Filled 2022-04-13: qty 2

## 2022-04-13 MED ORDER — DIPHENHYDRAMINE HCL 50 MG/ML IJ SOLN
25.0000 mg | Freq: Once | INTRAMUSCULAR | Status: AC
  Administered 2022-04-13: 25 mg via INTRAVENOUS
  Filled 2022-04-13: qty 1

## 2022-04-13 MED ORDER — LACTATED RINGERS IV BOLUS
1000.0000 mL | Freq: Once | INTRAVENOUS | Status: AC
  Administered 2022-04-13: 1000 mL via INTRAVENOUS

## 2022-04-13 MED ORDER — IOHEXOL 350 MG/ML SOLN
75.0000 mL | Freq: Once | INTRAVENOUS | Status: AC | PRN
  Administered 2022-04-13: 75 mL via INTRAVENOUS

## 2022-04-13 NOTE — ED Triage Notes (Signed)
Pt states she has had a cough for the past couple of months and tonight she was coughing and felt a pop in the back of her head and ever since she has had a throbbing pain on the right side of her head  states it is worse when she lays down

## 2022-04-13 NOTE — ED Provider Notes (Signed)
Blood pressure 137/74, pulse 81, temperature 98.3 F (36.8 C), temperature source Oral, resp. rate 15, height '4\' 11"'$  (1.499 m), weight 104.3 kg, last menstrual period 04/09/2022, SpO2 98 %.  Assuming care from Dr. Matilde Sprang.  In short, Tara Mathews is a 32 y.o. female with a chief complaint of Headache .  Refer to the original H&P for additional details.  The current plan of care is to follow up on neuro imaging and reassess.  07:18 AM  CTA independently interpreted and agree with radiology interpretation.  Patient is resting comfortably on my reassessment.  She has no focal neurodeficits.  She is feeling well.  Plan for continued supportive care at home with close PCP follow-up.   Margette Fast, MD 04/13/22 [phone]

## 2022-04-13 NOTE — Discharge Instructions (Signed)
You were seen in the emergency room today with headache.  Your CT scan is reassuring.  I have called in some lidocaine patches to your pharmacy.  Please be aware that occasionally insurance companies will not cover this without a prior authorization.  Your primary care physician could provide this but you could also use over-the-counter lidocaine or other topical muscle rubs with similar benefit.

## 2022-04-13 NOTE — ED Provider Notes (Signed)
[facility] EMERGENCY DEPARTMENT Provider Note  CSN: [id number] Arrival date & time: 04/13/22 0430  Chief Complaint(s) Headache  HPI Tara Mathews is a 32 y.o. female with PMH anxiety, GERD who presents emergency department for evaluation of a headache.  Patient states that she has had a persistent cough for the last 2 months and tonight coughed very hard, heard a "pop" on the right occipital scalp and had a sudden onset headache that radiates towards the crown of the head.  No associated numbness, tingling, weakness or visual deficits.  States that she has not had a headache like this before.   Past Medical History Past Medical History:  Diagnosis Date   Anxiety    GERD (gastroesophageal reflux disease)    Patient Active Problem List   Diagnosis Date Noted   SAB (spontaneous abortion) 11/06/2021   Skin tag of female perineum 11/05/2021   Postoperative visit 07/23/2020   Postoperative complication of skin involving drainage from surgical wound 07/02/2020   Encounter for post surgical wound check 06/24/2020   Dermoid cyst of ovary 06/17/2020   Dermoid cyst of ovary, left 03/12/2020   Breast mass in female 03/12/2020   Galactorrhea in female 03/12/2020   Shoulder dystocia during labor and delivery 05/08/2019   Labor and delivery, indication for care 05/07/2019   Preeclampsia 05/07/2019   Positive GBS test 05/02/2019   Obesity affecting pregnancy, antepartum 10/18/2018   Encounter for supervision of normal pregnancy 10/17/2018   Home Medication(s) Prior to Admission medications   Medication Sig Start Date End Date Taking? Authorizing Provider  ciprofloxacin (CILOXAN) 0.3 % ophthalmic solution Administer 1 drop, every 2 hours, while awake, for 2 days. Then 1 drop, every 4 hours, while awake, for the next 5 days. 04/09/22   Lynden Oxford Scales, PA-C                                                                                                                                     Past Surgical History Past Surgical History:  Procedure Laterality Date   EYE SURGERY Bilateral    March 2023   GALLBLADDER SURGERY     LAPAROTOMY N/A 06/17/2020   Procedure: LAPAROTOMY;  Surgeon: Griffin Basil, MD;  Location: [place];  Service: Gynecology;  Laterality: N/A;   OVARIAN CYST REMOVAL Left 06/17/2020   Procedure: OPEN OVARIAN CYSTECTOMY;  Surgeon: Griffin Basil, MD;  Location: [place];  Service: Gynecology;  Laterality: Left;   Family History Family History  Problem Relation Age of Onset   Obesity Mother    Healthy Mother    Arthritis Father    Hypertension Father    Obesity Father    Miscarriages / Stillbirths Maternal Aunt    Leukemia Maternal Aunt    Brain cancer Maternal Uncle    Diabetes Paternal Grandmother    Brain cancer Cousin     Social History Social History   Tobacco  Use   Smoking status: Never   Smokeless tobacco: Never  Vaping Use   Vaping Use: Never used  Substance Use Topics   Alcohol use: No   Drug use: No   Allergies Bactrim [sulfamethoxazole-trimethoprim]  Review of Systems Review of Systems  Neurological:  Positive for headaches.    Physical Exam Vital Signs  I have reviewed the triage vital signs BP (!) 140/77 (BP Location: Right Arm)   Pulse 90   Temp 98.4 F (36.9 C) (Oral)   Resp 16   Ht '4\' 11"'$  (1.499 m)   Wt 104.3 kg   LMP 04/09/2022 (Exact Date)   SpO2 98%   BMI 46.45 kg/m   Physical Exam Vitals and nursing note reviewed.  Constitutional:      General: She is not in acute distress.    Appearance: She is well-developed.  HENT:     Head: Normocephalic and atraumatic.  Eyes:     Conjunctiva/sclera: Conjunctivae normal.  Cardiovascular:     Rate and Rhythm: Normal rate and regular rhythm.     Heart sounds: No murmur heard. Pulmonary:     Effort: Pulmonary effort is normal. No respiratory distress.     Breath sounds: Normal breath sounds.  Abdominal:     Palpations: Abdomen is soft.      Tenderness: There is no abdominal tenderness.  Musculoskeletal:        General: No swelling.     Cervical back: Neck supple.  Skin:    General: Skin is warm and dry.     Capillary Refill: Capillary refill takes less than 2 seconds.  Neurological:     Mental Status: She is alert.     Cranial Nerves: No cranial nerve deficit.     Sensory: No sensory deficit.     Motor: No weakness.  Psychiatric:        Mood and Affect: Mood normal.     ED Results and Treatments Labs (all labs ordered are listed, but only abnormal results are displayed) Labs Reviewed  BASIC METABOLIC PANEL                                                                                                                          Radiology No results found.  Pertinent labs & imaging results that were available during my care of the patient were reviewed by me and considered in my medical decision making (see MDM for details).  Medications Ordered in ED Medications  prochlorperazine (COMPAZINE) injection 10 mg (has no administration in time range)  diphenhydrAMINE (BENADRYL) injection 25 mg (has no administration in time range)  lactated ringers bolus 1,000 mL (has no administration in time range)  Procedures Procedures  (including critical care time)  Medical Decision Making / ED Course   This patient presents to the ED for concern of headache, this involves an extensive number of treatment options, and is a complaint that carries with it a high risk of complications and morbidity.  The differential diagnosis includes carotid dissection, muscle strain, tension headache, migraine headache  MDM: Patient seen emerged part for evaluation of a headache.  Physical exam with tenderness along the trapezius and neurologic exam is unremarkable.  Laboratory evaluation unremarkable.  Patient  given headache cocktail for symptom control and at time of signout patient is pending CT imaging.  Given that the patient heard a "pop" that immediately preceded her headache, carotid dissection remains in the differential and this is a necessary scan.  Please see provider signout for continuation of workup.   Additional history obtained: -Additional history obtained from partner -External records from outside source obtained and reviewed including: Chart review including previous notes, labs, imaging, consultation notes   Lab Tests: -I ordered, reviewed, and interpreted labs.   The pertinent results include:   Labs Reviewed  BASIC METABOLIC PANEL         Imaging Studies ordered: I ordered imaging studies including CT angio brain and neck and this is pending   Medicines ordered and prescription drug management: Meds ordered this encounter  Medications   prochlorperazine (COMPAZINE) injection 10 mg   diphenhydrAMINE (BENADRYL) injection 25 mg   lactated ringers bolus 1,000 mL    -I have reviewed the patients home medicines and have made adjustments as needed  Critical interventions none   Cardiac Monitoring: The patient was maintained on a cardiac monitor.  I personally viewed and interpreted the cardiac monitored which showed an underlying rhythm of: NSR  Social Determinants of Health:  Factors impacting patients care include: none   Reevaluation: After the interventions noted above, I reevaluated the patient and found that they have :improved  Co morbidities that complicate the patient evaluation  Past Medical History:  Diagnosis Date   Anxiety    GERD (gastroesophageal reflux disease)       Dispostion: I considered admission for this patient, and disposition pending imaging studies.  Please see provider signout for continuation of workup     Final Clinical Impression(s) / ED Diagnoses Final diagnoses:  None     '@PCDICTATION'$ @    Teressa Lower,  MD 04/16/22 [id number]

## 2022-04-21 ENCOUNTER — Ambulatory Visit
Admission: EM | Admit: 2022-04-21 | Discharge: 2022-04-21 | Disposition: A | Discharge status: Home / Self Care | Payer: Self-pay | Attending: Urgent Care | Admitting: Urgent Care

## 2022-04-21 DIAGNOSIS — J018 Other acute sinusitis: Secondary | ICD-10-CM

## 2022-04-21 DIAGNOSIS — H5789 Other specified disorders of eye and adnexa: Secondary | ICD-10-CM

## 2022-04-21 MED ORDER — PSEUDOEPHEDRINE HCL 60 MG PO TABS: 60.0000 mg | ORAL_TABLET | Freq: Three times a day (TID) | ORAL | 0 refills | Status: DC | PRN

## 2022-04-21 MED ORDER — CETIRIZINE HCL 10 MG PO TABS: 10.0000 mg | ORAL_TABLET | Freq: Every day | ORAL | 0 refills | Status: DC

## 2022-04-21 MED ORDER — TOBRAMYCIN 0.3 % OP SOLN: 1.0000 [drp] | OPHTHALMIC | 0 refills | Status: DC

## 2022-04-21 MED ORDER — AMOXICILLIN-POT CLAVULANATE 875-125 MG PO TABS: 1.0000 | ORAL_TABLET | Freq: Two times a day (BID) | ORAL | 0 refills | Status: DC

## 2022-04-21 NOTE — ED Provider Notes (Signed)
[facility]  Note:  This document was prepared using Systems analyst and may include unintentional dictation errors.  MRN: [id number] DOB: [birth year]  Subjective:   Tara Mathews is a 32 y.o. female presenting for 32-monthhistory of persistent coughing drainage.  She has had intermittent ear popping, intermittent headaches.  In the past 2 days she woke up with recurrent eye drainage and mucus from her eyes with redness.  Was treated for conjunctivitis at the end of December.  She completed a course of ciprofloxacin eyedrops.  No current facility-administered medications for this encounter.  Current Outpatient Medications:    ciprofloxacin (CILOXAN) 0.3 % ophthalmic solution, Administer 1 drop, every 2 hours, while awake, for 2 days. Then 1 drop, every 4 hours, while awake, for the next 5 days., Disp: 5 mL, Rfl: 0   lidocaine (LIDODERM) 5 %, Place 1 patch onto the skin daily. Remove & Discard patch within 12 hours or as directed by MD, Disp: 30 patch, Rfl: 0   Allergies  Allergen Reactions   Bactrim [Sulfamethoxazole-Trimethoprim] Hives    Past Medical History:  Diagnosis Date   Anxiety    GERD (gastroesophageal reflux disease)      Past Surgical History:  Procedure Laterality Date   EYE SURGERY Bilateral    March 2023   GALLBLADDER SURGERY     LAPAROTOMY N/A 06/17/2020   Procedure: LAPAROTOMY;  Surgeon: BGriffin Basil MD;  [place]  Service: Gynecology;  Laterality: N/A;   OVARIAN CYST REMOVAL Left 06/17/2020   Procedure: OPEN OVARIAN CYSTECTOMY;  Surgeon: BGriffin Basil MD;  [place]  Service: Gynecology;  Laterality: Left;    Family History  Problem Relation Age of Onset   Obesity Mother    Healthy Mother    Arthritis Father    Hypertension Father    Obesity Father    Miscarriages / Stillbirths Maternal Aunt    Leukemia Maternal Aunt    Brain cancer Maternal Uncle    Diabetes Paternal  Grandmother    Brain cancer Cousin     Social History   Tobacco Use   Smoking status: Never   Smokeless tobacco: Never  Vaping Use   Vaping Use: Never used  Substance Use Topics   Alcohol use: No   Drug use: No    ROS   Objective:   Vitals: BP 127/83 (BP Location: Right Arm)   Pulse 86   Temp 99.1 F (37.3 C) (Oral)   Resp 15   LMP 04/09/2022 (Exact Date)   SpO2 94%   Physical Exam Constitutional:      General: She is not in acute distress.    Appearance: Normal appearance. She is well-developed and normal weight. She is not ill-appearing, toxic-appearing or diaphoretic.  HENT:     Head: Normocephalic and atraumatic.     Right Ear: Tympanic membrane, ear canal and external ear normal. No drainage or tenderness. No middle ear effusion. There is no impacted cerumen. Tympanic membrane is not erythematous or bulging.     Left Ear: Tympanic membrane, ear canal and external ear normal. No drainage or tenderness.  No middle ear effusion. There is no impacted cerumen. Tympanic membrane is not erythematous or bulging.     Nose: Congestion present. No rhinorrhea.     Mouth/Throat:     Mouth: Mucous membranes are moist. No oral lesions.     Pharynx: No pharyngeal swelling, oropharyngeal exudate, posterior oropharyngeal erythema or uvula swelling.  Tonsils: No tonsillar exudate or tonsillar abscesses.  Eyes:     General: Lids are normal. Lids are everted, no foreign bodies appreciated. Vision grossly intact. No scleral icterus.       Right eye: No foreign body, discharge or hordeolum.        Left eye: No foreign body, discharge or hordeolum.     Extraocular Movements: Extraocular movements intact.     Right eye: Normal extraocular motion.     Left eye: Normal extraocular motion and no nystagmus.     Conjunctiva/sclera:     Right eye: Right conjunctiva is injected. No chemosis, exudate or hemorrhage.    Left eye: Left conjunctiva is injected. No chemosis, exudate or  hemorrhage. Cardiovascular:     Rate and Rhythm: Normal rate and regular rhythm.     Heart sounds: Normal heart sounds. No murmur heard.    No friction rub. No gallop.  Pulmonary:     Effort: Pulmonary effort is normal. No respiratory distress.     Breath sounds: No stridor. No wheezing, rhonchi or rales.  Chest:     Chest wall: No tenderness.  Musculoskeletal:     Cervical back: Normal range of motion and neck supple.  Lymphadenopathy:     Cervical: No cervical adenopathy.  Skin:    General: Skin is warm and dry.  Neurological:     General: No focal deficit present.     Mental Status: She is alert and oriented to person, place, and time.  Psychiatric:        Mood and Affect: Mood normal.        Behavior: Behavior normal.     Assessment and Plan :   PDMP not reviewed this encounter.  1. Acute non-recurrent sinusitis of other sinus   2. Redness of both eyes     Will start empiric treatment for sinusitis with Augmentin and tobramycin for secondary bacterial conjunctivitis of both eyes.  Recommended supportive care otherwise. Counseled patient on potential for adverse effects with medications prescribed/recommended today, ER and return-to-clinic precautions discussed, patient verbalized understanding.    Tara Mathews, [place] 04/21/22 [phone]

## 2022-04-21 NOTE — ED Triage Notes (Signed)
Pt states she woke this am with bilat eye drainage-cough x 2 months-NAD-steady gait

## 2022-04-28 ENCOUNTER — Ambulatory Visit: Payer: Self-pay | Admitting: Nurse Practitioner

## 2022-05-18 ENCOUNTER — Ambulatory Visit: Payer: Self-pay | Admitting: Nurse Practitioner

## 2022-05-18 ENCOUNTER — Encounter: Payer: Self-pay | Admitting: Nurse Practitioner

## 2022-05-18 DIAGNOSIS — K219 Gastro-esophageal reflux disease without esophagitis: Secondary | ICD-10-CM

## 2022-05-18 MED ORDER — OMEPRAZOLE 40 MG PO CPDR: 40.0000 mg | DELAYED_RELEASE_CAPSULE | Freq: Every day | ORAL | 3 refills | Status: DC

## 2022-05-18 NOTE — Progress Notes (Signed)
States she does not need this appointment and was not sure why it was made for her or who made it.

## 2022-05-31 ENCOUNTER — Encounter: Payer: Self-pay | Admitting: Nurse Practitioner

## 2022-05-31 ENCOUNTER — Other Ambulatory Visit: Payer: Self-pay | Admitting: Nurse Practitioner

## 2022-05-31 MED ORDER — OMEPRAZOLE 40 MG PO CPDR
40.0000 mg | DELAYED_RELEASE_CAPSULE | Freq: Every day | ORAL | 3 refills | Status: AC
  Filled 2022-05-31: qty 90, 90d supply, fill #0
  Filled 2023-03-23: qty 90, 90d supply, fill #1
  Filled 2023-05-30: qty 90, 90d supply, fill #2

## 2022-06-01 ENCOUNTER — Other Ambulatory Visit: Payer: Self-pay

## 2022-09-09 ENCOUNTER — Other Ambulatory Visit: Payer: Self-pay

## 2022-09-10 ENCOUNTER — Ambulatory Visit
Admission: RE | Admit: 2022-09-10 | Discharge: 2022-09-10 | Disposition: A | Discharge status: Home / Self Care | Payer: Self-pay | Source: Ambulatory Visit | Attending: Obstetrics and Gynecology | Admitting: Obstetrics and Gynecology

## 2022-09-10 ENCOUNTER — Ambulatory Visit
Admission: RE | Admit: 2022-09-10 | Discharge: 2022-09-10 | Disposition: A | Discharge status: Home / Self Care | Payer: No Typology Code available for payment source | Source: Ambulatory Visit | Attending: Obstetrics and Gynecology | Admitting: Obstetrics and Gynecology

## 2022-09-10 ENCOUNTER — Other Ambulatory Visit: Payer: Self-pay | Admitting: Obstetrics and Gynecology

## 2022-09-10 DIAGNOSIS — N6489 Other specified disorders of breast: Secondary | ICD-10-CM

## 2022-11-24 IMAGING — US US ABDOMEN LIMITED
1 series · 14 of 25 positions shown · non-contrast
Comparison: None

CLINICAL DATA: RIGHT upper quadrant abdominal pain

EXAM:
ULTRASOUND ABDOMEN LIMITED RIGHT UPPER QUADRANT

[Series 1: us abdomen limited · 14 of 58 slices shown]
[im 1/58]
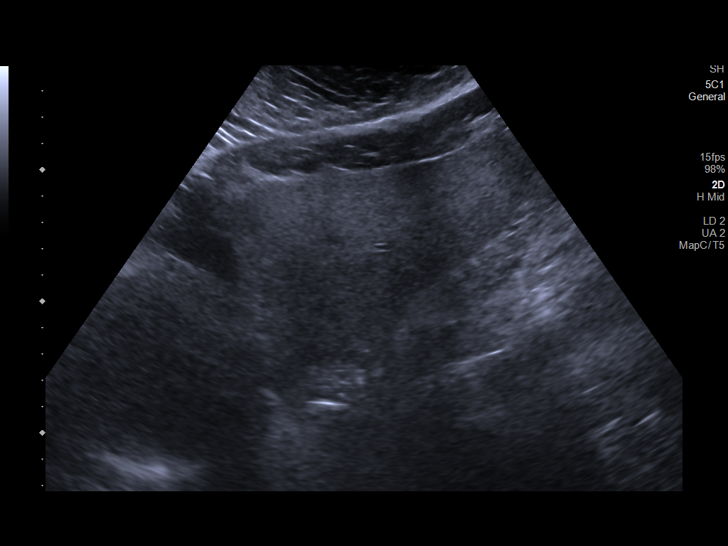
[im 5/58]
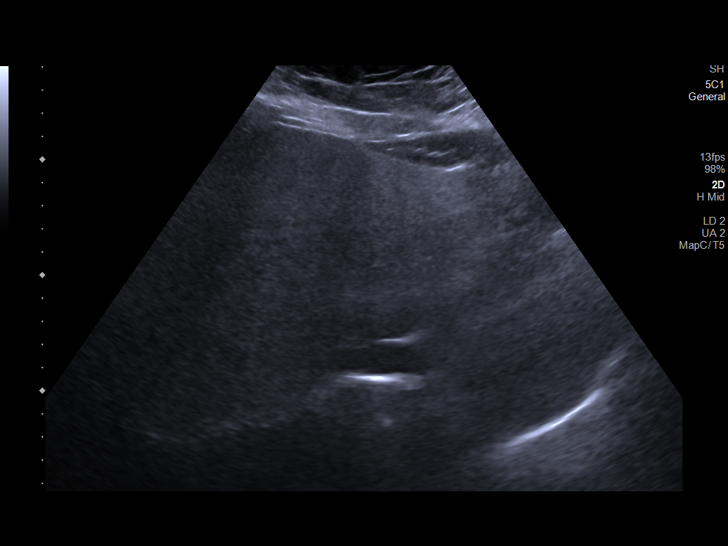
[im 10/58]
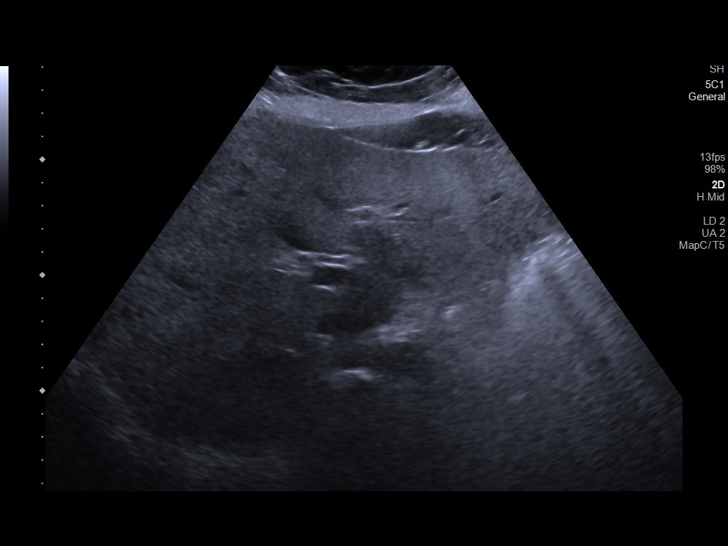
[im 15/58]
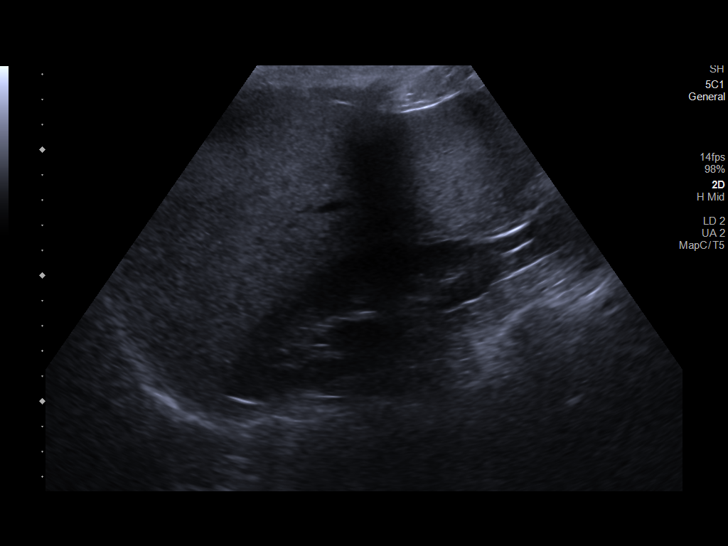
[im 20/58]
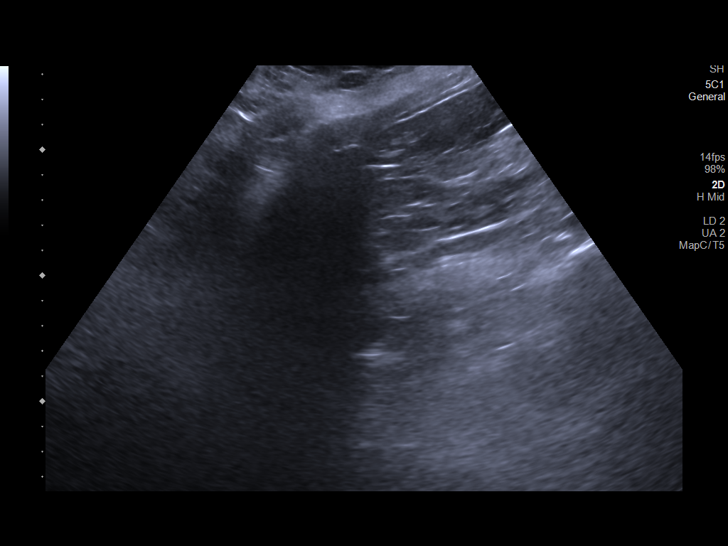
[im 22/58]
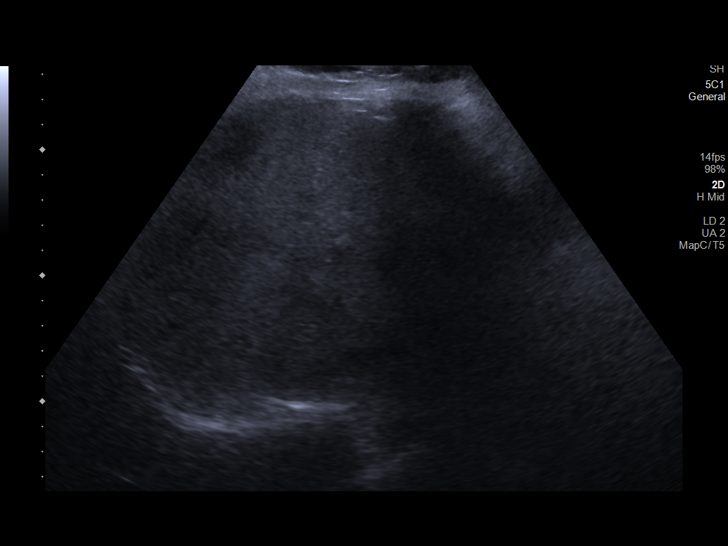
[im 27/58]
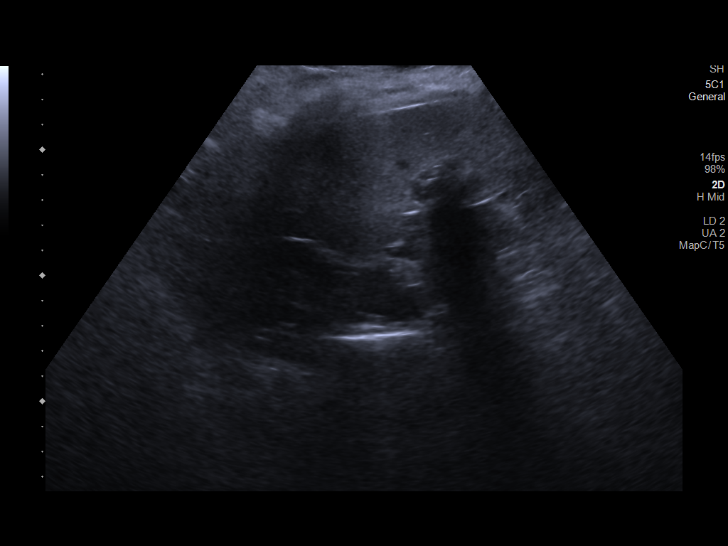
[im 31/58]
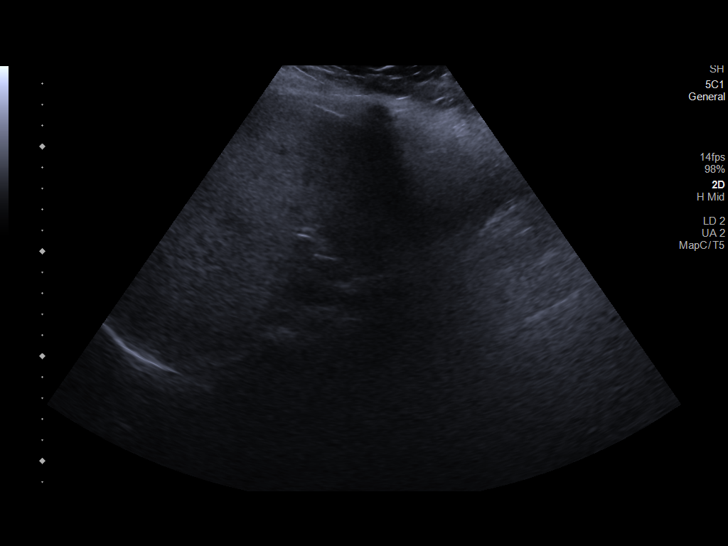
[im 36/58]
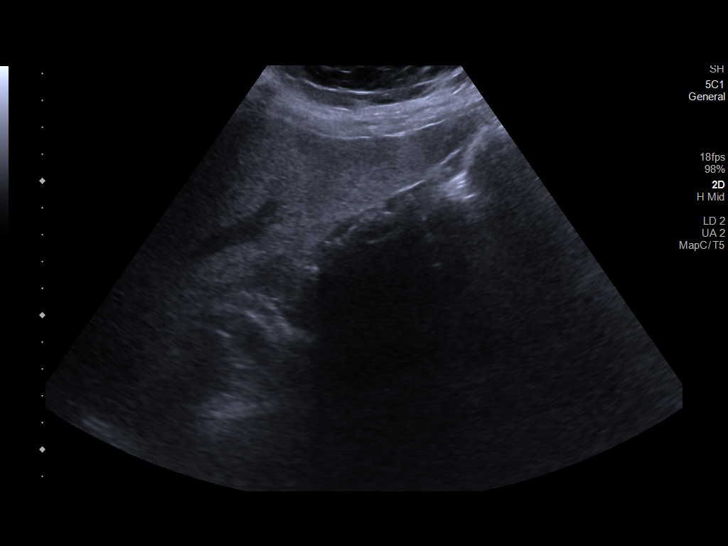
[im 39/58]
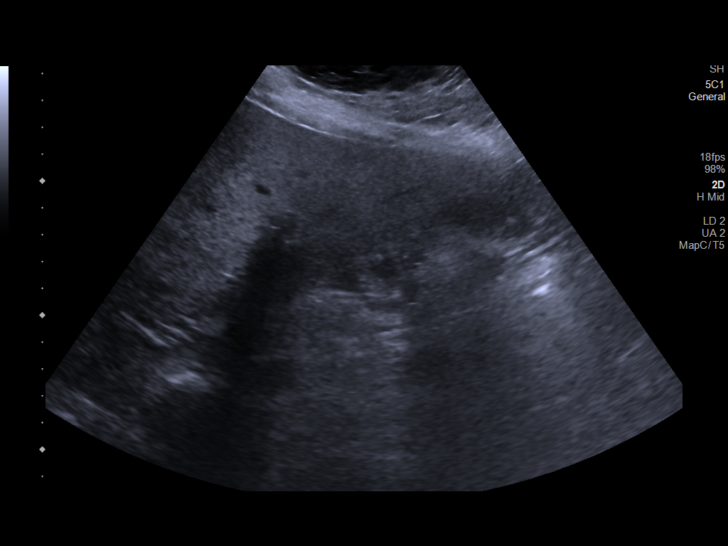
[im 43/58]
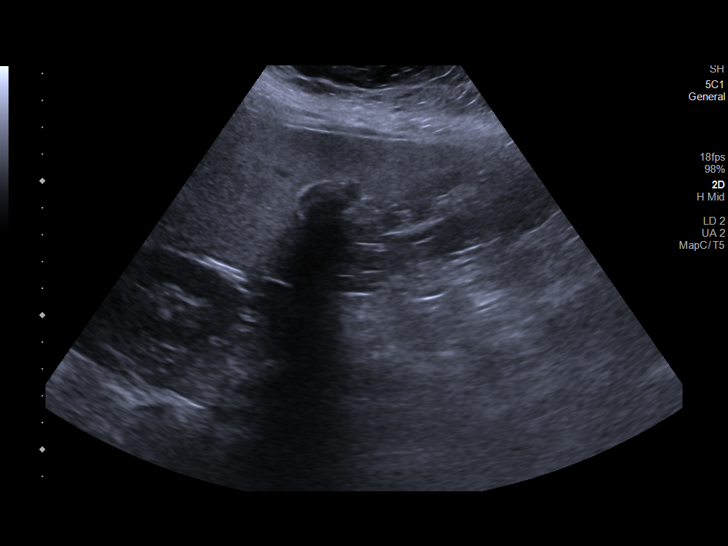
[im 48/58]
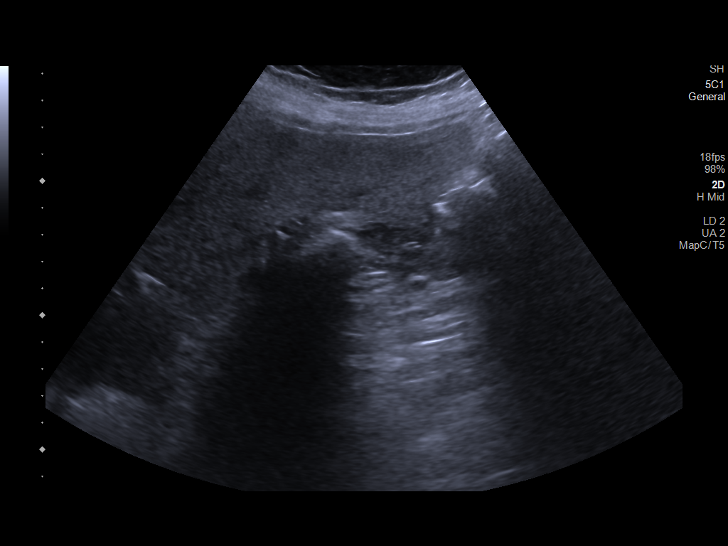
[im 53/58]
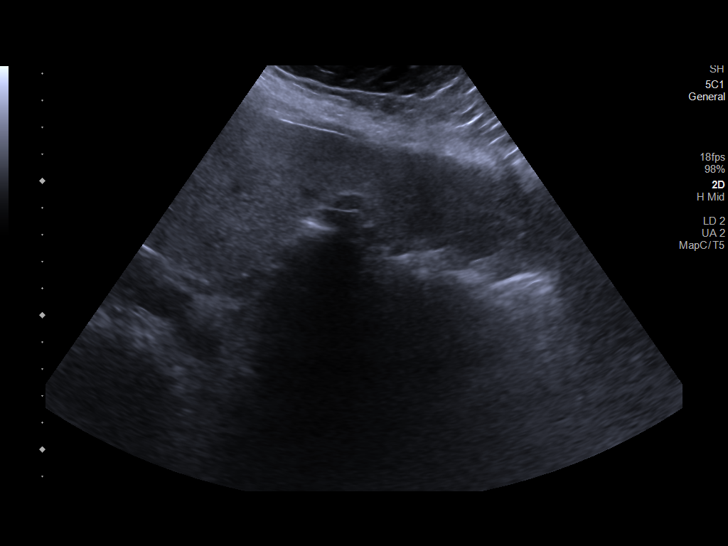
[im 58/58]
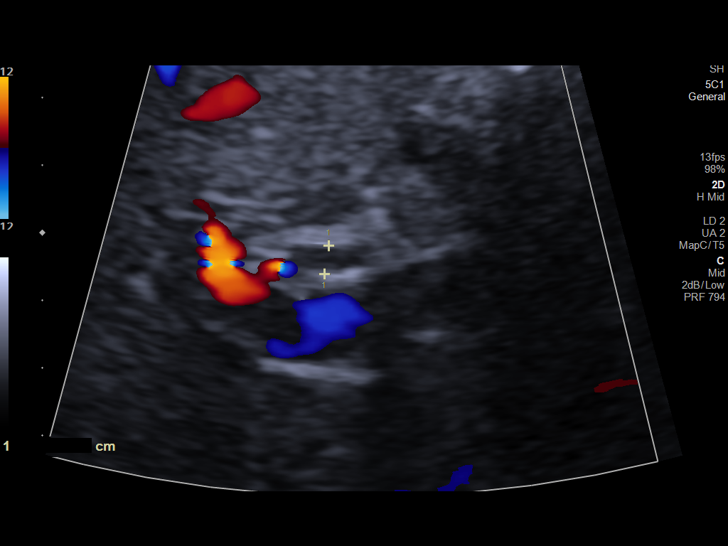

[14 of 25 positions shown; findings below may reference images not displayed]

FINDINGS: Gallbladder:

Multiple shadowing calculi filled gallbladder creating a wall echo
shadow complex. Sonographic Murphy sign present. Unable to
accurately measure wall thickness. No obvious pericholecystic fluid.

Common bile duct:

Diameter: 4 mm

Liver:

Echogenic parenchyma, likely fatty infiltration though this can be
seen with cirrhosis and certain infiltrative disorders. No focal
hepatic mass or nodularity. Portal vein is patent on color Doppler
imaging with normal direction of blood flow towards the liver.

Other: No RIGHT upper quadrant free fluid.
IMPRESSION: Gallbladder filled with multiple shadowing calculi, with presence of
a sonographic Murphy sign.

Findings are suspicious for acute cholecystitis.

## 2023-03-17 ENCOUNTER — Ambulatory Visit: Payer: Self-pay

## 2023-03-23 ENCOUNTER — Other Ambulatory Visit: Payer: Self-pay

## 2023-03-24 ENCOUNTER — Ambulatory Visit: Payer: Self-pay | Admitting: Hematology and Oncology

## 2023-03-24 ENCOUNTER — Ambulatory Visit
Admission: RE | Admit: 2023-03-24 | Discharge: 2023-03-24 | Disposition: A | Discharge status: Home / Self Care | Payer: No Typology Code available for payment source | Source: Ambulatory Visit | Attending: Obstetrics and Gynecology | Admitting: Obstetrics and Gynecology

## 2023-03-24 VITALS — BP 128/86 | Wt 233.0 lb

## 2023-03-24 DIAGNOSIS — N6489 Other specified disorders of breast: Secondary | ICD-10-CM

## 2023-03-24 NOTE — Progress Notes (Addendum)
[name] is a 32 y.o. female who presents to [facility] clinic today with no complaints. Follow up 6 month stable left breast asymmetry.    Pap Smear: Pap not smear completed today. Last Pap smear was 12/03/2020 and was normal. Per patient has no history of an abnormal Pap smear. Last Pap smear result is available in Epic.   Physical exam: Breasts Breasts symmetrical. No skin abnormalities bilateral breasts. No nipple retraction bilateral breasts. No nipple discharge bilateral breasts. No lymphadenopathy. No lumps palpated bilateral breasts.     MM DIAG BREAST TOMO UNI LEFT Result Date: 09/10/2022 CLINICAL DATA:  Six-month interval follow-up of a likely benign focal asymmetry involving the inner LEFT breast at middle depth. No mass or other abnormality was identified prior ultrasound. EXAM: DIGITAL DIAGNOSTIC UNILATERAL LEFT MAMMOGRAM WITH TOMOSYNTHESIS; ULTRASOUND LEFT BREAST LIMITED TECHNIQUE: Left digital diagnostic mammography and breast tomosynthesis was performed.; Targeted ultrasound examination of the left breast was performed. COMPARISON:  Previous exam(s). ACR Breast Density Category b: There are scattered areas of fibroglandular density. FINDINGS: Full field CC and MLO views were obtained. The focal asymmetry in the inner breast at middle depth is unchanged, and likely represents overlapping asymmetric fibroglandular tissue. The asymmetry localizes to the lower retroareolar location on the MLO view, therefore at or near 9 o'clock. There is no associated architectural distortion or suspicious calcifications. Targeted ultrasound is performed in the inner breast, demonstrating scattered fibroglandular tissue at the 9 o'clock position 9 cm from the nipple with a similar morphology to the mammographic asymmetry. Within this fibroglandular tissue are mild fibrocystic changes. No suspicious solid mass or abnormal acoustic shadowing is identified. IMPRESSION: Stable focal asymmetry involving  the inner LEFT breast, likely representing asymmetric fibroglandular tissue within which are mild fibrocystic changes. RECOMMENDATION: Diagnostic BILATERAL mammography in 6 months. I have discussed the findings and recommendations with the patient. If applicable, a reminder letter will be sent to the patient regarding the next appointment. BI-RADS CATEGORY  3: Probably benign. Electronically Signed   By: Hulan Saas M.D.   On: 09/10/2022 11:26  MS DIGITAL DIAG TOMO BILAT Result Date: 03/11/2022 CLINICAL DATA:  The patient presents with nonfocal lateral left breast pain, lumpiness in both axillary regions, and non spontaneous left milky nipple discharge. EXAM: DIGITAL DIAGNOSTIC BILATERAL MAMMOGRAM WITH TOMOSYNTHESIS; ULTRASOUND LEFT BREAST LIMITED; [place] AXILLARY RIGHT TECHNIQUE: Bilateral digital diagnostic mammography and breast tomosynthesis was performed.; Targeted ultrasound examination of the left breast was performed.; Targeted ultrasound examination of the right axilla was performed. COMPARISON:  No previous mammography available. This is a baseline study. ACR Breast Density Category b: There are scattered areas of fibroglandular density. FINDINGS: There is an asymmetry in the medial left breast which improves but does not completely resolve on additional imaging. No other suspicious mammographic findings identified in either breast. Targeted ultrasound is performed, showing no sonographic abnormalities in the left retroareolar region, either axilla, or the upper inner left breast. IMPRESSION: Probably benign medial left breast asymmetry. No cause for the patient's axillary lumps or non spontaneous left milky nipple discharge identified. RECOMMENDATION: 1. Recommend six-month follow-up mammography of the left breast asymmetry. 2. Treatment of the axillary lump should be based on clinical and physical exam given lack of imaging findings. 3. The patient's nipple discharge is not typical for a papilloma  or malignancy. Worrisome discharge is unilateral, spontaneous, and clear or bloody. If the patient's discharge becomes worrisome, MRI of the breast could further evaluate. I have discussed the findings and recommendations with  the patient. If applicable, a reminder letter will be sent to the patient regarding the next appointment. BI-RADS CATEGORY  3: Probably benign. Electronically Signed   By: Gerome Sam III M.D.   On: 03/11/2022 12:20     Pelvic/Bimanual Pap is not indicated today    Smoking History: Patient has never smoked and was not referred to quit line.    Patient Navigation: Patient education provided. Access to services provided for patient through [facility] program. No interpreter provided. No transportation provided   Colorectal Cancer Screening: Per patient has never had colonoscopy completed No complaints today.    Breast and Cervical Cancer Risk Assessment: Patient does not have family history of breast cancer, known genetic mutations, or radiation treatment to the chest before age 3. Patient does not have history of cervical dysplasia, immunocompromised, or DES exposure in-utero.  Risk Assessment   No risk assessment data for the current encounter  Risk Scores       10/15/2021   Last edited by: Narda Rutherford, LPN   5-year risk:    Lifetime risk:               A: BCCCP exam without pap smear No complaints with benign exam. Follow up 6 month left breast asymmetry.   P: Referred patient to the [facility] for a diagnostic mammogram. Appointment scheduled 03/24/23.  Tara Lux, NP 03/24/2023 8:23 AM

## 2023-03-24 NOTE — Patient Instructions (Signed)
Taught Tara Mathews about self breast awareness and gave educational materials to take home. Patient did not need a Pap smear today due to last Pap smear was in 12/03/2020 per patient.  Let her know [facility] will cover Pap smears every 5 years unless has a history of abnormal Pap smears. Referred patient to the [facility] for diagnostic mammogram. Appointment scheduled for 03/24/2023. Patient aware of appointment and will be there. Let patient know will follow up with her within the next couple weeks with results. Tara Mathews verbalized understanding.  Pascal Lux, NP 8:27 AM

## 2023-05-30 ENCOUNTER — Other Ambulatory Visit: Payer: Self-pay | Admitting: Nurse Practitioner

## 2023-05-30 ENCOUNTER — Other Ambulatory Visit: Payer: Self-pay

## 2023-06-01 ENCOUNTER — Other Ambulatory Visit: Payer: Self-pay

## 2023-06-01 ENCOUNTER — Encounter: Payer: Self-pay | Admitting: Pharmacist

## 2023-06-15 ENCOUNTER — Other Ambulatory Visit: Payer: Self-pay | Admitting: Nurse Practitioner

## 2023-06-16 NOTE — Telephone Encounter (Signed)
 Previous request refused- needs appointment Requested Prescriptions  Pending Prescriptions Disp Refills   omeprazole (PRILOSEC) 40 MG capsule 90 capsule 3    Sig: Take 1 capsule (40 mg total) by mouth daily.     Gastroenterology: Proton Pump Inhibitors Failed - 06/16/2023 12:54 PM      Failed - Valid encounter within last 12 months    Recent Outpatient Visits           1 year ago Breast tenderness   [facility] [place]. [facility] [place], Marzella Schlein, [place]   2 years ago Hospital discharge follow-up   [facility] Of [place]. [facility] Cla[place]orne Rigg, NP   2 years ago Blood in the stool   [facility] [place]. [facility] Claiborne Rigg, NP   2 years ago Hospital discharge follow-up   [facility] Of . [facility] [place], [place] W, NP   3 years ago Gastroesophageal reflux disease with esophagitis without hemorrhage   [facility] Dept Of . [facility] Claiborne Rigg, [place]

## 2023-06-21 ENCOUNTER — Encounter: Payer: Self-pay | Admitting: Pharmacist

## 2023-06-21 ENCOUNTER — Other Ambulatory Visit: Payer: Self-pay

## 2024-02-14 ENCOUNTER — Other Ambulatory Visit: Payer: Self-pay

## 2024-02-14 ENCOUNTER — Encounter: Payer: Self-pay | Admitting: Family Medicine

## 2024-02-14 ENCOUNTER — Ambulatory Visit: Payer: Self-pay | Admitting: Family Medicine

## 2024-02-14 VITALS — BP 132/88 | Ht 59.0 in | Wt 233.0 lb

## 2024-02-14 DIAGNOSIS — M25562 Pain in left knee: Secondary | ICD-10-CM

## 2024-02-14 NOTE — Progress Notes (Unsigned)
 DATE OF VISIT: 02/14/2024        Tara Mathews DOB: 10/06/1990 MRN: [id number]  Discussed the use of AI scribe software for clinical note transcription with the patient, who gave verbal consent to proceed.  History of Present Illness Tara Mathews is a 33 year old female who presents with left knee pain following a fall.  Knee pain and injury - Acute onset of left knee pain following a fall onto the knee after slipping on laundry detergent last Monday (02/06/24) while retrieving her son's book bag. - Immediate ability to ambulate after the fall, with limping and initial large bruise. - Pain worsened after increased activity, including walking and taking her son trick-or-treating on Friday. - Soreness localized to the medial aspect of the knee, with sharp pain at the superior aspect. - Numbness present in certain areas of the knee. - Sensation of movement or liquid within the knee noted by Thursday after the injury. - No prior history of injury to this knee.  Functional impact and supportive devices - Continued ambulation despite pain, including participation in activities with her son. - Use of a knee brace for support, which provides some relief but causes discomfort due to metal parts digging into her thigh.  Evaluation and diagnostic findings - Visited urgent care on Saturday did not have any openings, returned the following day and was, evaluated on Sunday. - Knee x-rays performed; informed of possible bone bruise or small chip off the patella. - Describes the area as feeling 'grindy.'  Medications:  Outpatient Encounter Medications as of 02/14/2024  Medication Sig   cetirizine  (ZYRTEC  ALLERGY) 10 MG tablet Take 1 tablet (10 mg total) by mouth daily. (Patient not taking: Reported on 03/24/2023)   omeprazole  (PRILOSEC) 40 MG capsule Take 1 capsule (40 mg total) by mouth daily.   No facility-administered encounter medications on file as of 02/14/2024.     Allergies: is allergic to bactrim [sulfamethoxazole-trimethoprim].  Physical Examination: Vitals: BP 132/88   Ht 4' 11 (1.499 m)   Wt 233 lb (105.7 kg)   BMI 47.06 kg/m  GENERAL:  Tara Mathews is a 32 y.o. female appearing their stated age, alert and oriented x 3, in no apparent distress.  SKIN: no rashes or lesions, skin clean, dry, intact MSK: Knee: Left knee with fading ecchymosis, no significant swelling or effusion.  Diffuse tenderness along the anterior knee along the patella.  No palpable defects or step-offs.  Some associated medial joint line tenderness as well.  Extensor mechanism is intact.  Negative Lachman, negative varus and valgus stress.  Pain, but negative patella apprehension. Right knee with full range of motion without pain, weakness, instability NEURO: sensation intact to light touch VASC: pulses 2+ and symmetric DP/PT bilaterally, no edema  Radiology: Left knee x-ray 02/12/2024 at urgent care personally reviewed and interpreted by me today showing: -Possible irregularity of the superior pole of the patella, best visualized on lateral view.  Not well-visualized on other views - No other bony abnormalities  Limited MSK ultrasound left knee Date: 02/14/2024 Indication: Left knee pain Finding: - No effusion in the suprapatellar pouch.  Normal-appearing quad tendon - Normal-appearing patella both superiorly and inferiorly without any definitive bony abnormalities - Patellar tendon with hypoechoic change along the proximal aspect, no discrete tears.  No increased Doppler flow - Medial meniscus normal in appearance, normal-appearing MCL - Normal-appearing lateral meniscus, normal-appearing LCL  Impression: Unremarkable limited left knee ultrasound Images and interpretation completed by Rainell Cedar, DO  Assessment & Plan Left knee pain status post fall over a week ago, x-rays concerning for possible abnormality of the superior pole of the patella   Differential diagnosis includes: Patellar avulsion fracture, bone contusion, other abnormality -X-ray images and urgent care notes reviewed, concern for possible  possible bone bruise or small chip off kneecap.  - MRI left knee ordered to rule out occult fracture and other soft tissue injury.  She is self-pay, provided information on affordable options.  She will call them to schedule - She should continue her knee immobilizer at this time - Should follow-up after MRI to review results and discuss further treatment   Patient expressed understanding & agreement with above.  Encounter Diagnosis  Name Primary?   Acute pain of left knee Yes    Orders Placed This Encounter  Procedures   US  LIMITED JOINT SPACE STRUCTURES LOW LEFT   MR Knee Left  Wo Contrast     VISIT SUMMARY: You visited us  today due to knee pain following a fall last Monday. You initially thought it was a bruise, but the pain worsened after increased activity. You have been using a knee brace for support, which provides some relief but also causes discomfort.  YOUR PLAN: -LEFT KNEE CONTUSION AND PAIN: A contusion is a bruise, and in your case, it is on your left knee. The x-rays showed a possible bone bruise or a small chip off your kneecap. An ultrasound showed no significant swelling. You should follow up as needed based on your symptoms. Continue using the knee brace if it helps, but ensure it does not cause additional discomfort.  INSTRUCTIONS: Please follow up as needed based on your symptoms. If the pain worsens or you experience new symptoms, contact our office for further evaluation. Contains text generated by Abridge.

## 2024-02-15 ENCOUNTER — Telehealth: Payer: Self-pay

## 2024-02-15 NOTE — Telephone Encounter (Signed)
 MRI order sent to [facility].

## 2024-02-19 ENCOUNTER — Ambulatory Visit
Admission: RE | Admit: 2024-02-19 | Discharge: 2024-02-19 | Disposition: A | Payer: Self-pay | Source: Ambulatory Visit | Attending: Family Medicine | Admitting: Family Medicine

## 2024-02-19 DIAGNOSIS — M25562 Pain in left knee: Secondary | ICD-10-CM

## 2024-02-20 ENCOUNTER — Ambulatory Visit: Payer: Self-pay | Admitting: Family Medicine

## 2024-02-20 NOTE — Progress Notes (Signed)
 MRI report and images reviewed.  It appears to have soft tissue edema, best viewed on T2 images consistent with contusion.  No other structural abnormalities.  No signs of patellar avulsion or other patellar injury which was concerning based on images from urgent care.  MyChart message sent.

## 2024-03-15 ENCOUNTER — Other Ambulatory Visit: Payer: Self-pay

## 2024-03-15 DIAGNOSIS — N6489 Other specified disorders of breast: Secondary | ICD-10-CM

## 2024-05-03 ENCOUNTER — Ambulatory Visit: Payer: Self-pay

## 2024-05-03 ENCOUNTER — Ambulatory Visit: Payer: Self-pay | Admitting: *Deleted

## 2024-05-03 ENCOUNTER — Ambulatory Visit
Admission: RE | Admit: 2024-05-03 | Discharge: 2024-05-03 | Disposition: A | Discharge status: Home / Self Care | Payer: Self-pay | Source: Ambulatory Visit | Attending: Obstetrics and Gynecology | Admitting: Obstetrics and Gynecology

## 2024-05-03 VITALS — BP 143/81 | Wt 234.7 lb

## 2024-05-03 DIAGNOSIS — Z1239 Encounter for other screening for malignant neoplasm of breast: Secondary | ICD-10-CM

## 2024-05-03 DIAGNOSIS — N6489 Other specified disorders of breast: Secondary | ICD-10-CM

## 2024-05-03 NOTE — Progress Notes (Signed)
 [name] is a 34 y.o. G2P1001 female who presents to [facility] clinic today with complaint of left breast pain during period that has decreased since previous exam 10/15/2021. Patient had a diagnostic mammogram completed 03/24/2023 that was probably benign that a one year bilateral diagnostic mammogram is recommended for follow up.   Pap Smear: Pap smear not completed today. Last Pap smear was 12/03/2020 at the [facility] and normal. Patients previous Pap smear was 10/18/2018 and was normal with positive HPV. Per patient has no history of an abnormal Pap smear. Last Pap smear result is available in Epic.    Physical exam: Breasts Breasts symmetrical. No skin abnormalities bilateral breasts. No nipple retraction bilateral breasts. No nipple discharge bilateral breasts. No lymphadenopathy. No lumps palpated bilateral breasts. No complaints of pain or tenderness on exam.      MM 3D DIAGNOSTIC MAMMOGRAM BILATERAL BREAST Result Date: 03/24/2023 CLINICAL DATA:  34 year old female presenting for 1 year follow-up of a probably benign left breast asymmetry. EXAM: DIGITAL DIAGNOSTIC BILATERAL MAMMOGRAM WITH TOMOSYNTHESIS AND CAD TECHNIQUE: Bilateral digital diagnostic mammography and breast tomosynthesis was performed. The images were evaluated with computer-aided detection. COMPARISON:  Previous exam(s). ACR Breast Density Category b: There are scattered areas of fibroglandular density. FINDINGS: Right breast: No suspicious mass, distortion, or microcalcifications are identified to suggest presence of malignancy. Left breast: There is a stable asymmetry best seen on cc view in the medial breast. There are no new suspicious findings elsewhere in the left breast. IMPRESSION: 1. Stable probably benign asymmetry without sonographic correlate in the medial left breast. 2. No mammographic evidence of malignancy in the right breast. RECOMMENDATION: Diagnostic bilateral  mammogram in 1 year. I have discussed the findings and recommendations with the patient. If applicable, a reminder letter will be sent to the patient regarding the next appointment. BI-RADS CATEGORY  3: Probably benign. Electronically Signed   By: Inocente Ast M.D.   On: 03/24/2023 10:04   MM DIAG BREAST TOMO UNI LEFT Result Date: 09/10/2022 CLINICAL DATA:  Six-month interval follow-up of a likely benign focal asymmetry involving the inner LEFT breast at middle depth. No mass or other abnormality was identified prior ultrasound. EXAM: DIGITAL DIAGNOSTIC UNILATERAL LEFT MAMMOGRAM WITH TOMOSYNTHESIS; ULTRASOUND LEFT BREAST LIMITED TECHNIQUE: Left digital diagnostic mammography and breast tomosynthesis was performed.; Targeted ultrasound examination of the left breast was performed. COMPARISON:  Previous exam(s). ACR Breast Density Category b: There are scattered areas of fibroglandular density. FINDINGS: Full field CC and MLO views were obtained. The focal asymmetry in the inner breast at middle depth is unchanged, and likely represents overlapping asymmetric fibroglandular tissue. The asymmetry localizes to the lower retroareolar location on the MLO view, therefore at or near 9 o'clock. There is no associated architectural distortion or suspicious calcifications. Targeted ultrasound is performed in the inner breast, demonstrating scattered fibroglandular tissue at the 9 o'clock position 9 cm from the nipple with a similar morphology to the mammographic asymmetry. Within this fibroglandular tissue are mild fibrocystic changes. No suspicious solid mass or abnormal acoustic shadowing is identified. IMPRESSION: Stable focal asymmetry involving the inner LEFT breast, likely representing asymmetric fibroglandular tissue within which are mild fibrocystic changes. RECOMMENDATION: Diagnostic BILATERAL mammography in 6 months. I have discussed the findings and recommendations with the patient. If applicable, a reminder  letter will be sent to the patient regarding the next appointment. BI-RADS CATEGORY  3: Probably benign. Electronically Signed   By: Debby Jerilynn HERO.D.  On: 09/10/2022 11:26  MS DIGITAL DIAG TOMO BILAT Result Date: 03/11/2022 CLINICAL DATA:  The patient presents with nonfocal lateral left breast pain, lumpiness in both axillary regions, and non spontaneous left milky nipple discharge. EXAM: DIGITAL DIAGNOSTIC BILATERAL MAMMOGRAM WITH TOMOSYNTHESIS; ULTRASOUND LEFT BREAST LIMITED; [place]  AXILLARY RIGHT TECHNIQUE: Bilateral digital diagnostic mammography and breast tomosynthesis was performed.; Targeted ultrasound examination of the left breast was performed.; Targeted ultrasound examination of the right axilla was performed. COMPARISON:  No previous mammography available. This is a baseline study. ACR Breast Density Category b: There are scattered areas of fibroglandular density. FINDINGS: There is an asymmetry in the medial left breast which improves but does not completely resolve on additional imaging. No other suspicious mammographic findings identified in either breast. Targeted ultrasound is performed, showing no sonographic abnormalities in the left retroareolar region, either axilla, or the upper inner left breast. IMPRESSION: Probably benign medial left breast asymmetry. No cause for the patient's axillary lumps or non spontaneous left milky nipple discharge identified. RECOMMENDATION: 1. Recommend six-month follow-up mammography of the left breast asymmetry. 2. Treatment of the axillary lump should be based on clinical and physical exam given lack of imaging findings. 3. The patient's nipple discharge is not typical for a papilloma or malignancy. Worrisome discharge is unilateral, spontaneous, and clear or bloody. If the patient's discharge becomes worrisome, MRI of the breast could further evaluate. I have discussed the findings and recommendations with the patient. If applicable, a reminder letter  will be sent to the patient regarding the next appointment. BI-RADS CATEGORY  3: Probably benign. Electronically Signed   By: Alm Pouch III M.D.   On: 03/11/2022 12:20   Pelvic/Bimanual Patient is due for Pap smear. Patient is currently having menstrual period. Patient has rescheduled Pap smear for the free cervical cancer screening on Thursday, June 14, 2024 at 1830.  Smoking History: Patient has never smoked.  Patient Navigation: Patient education provided. Access to services provided for patient through [facility] program.    Breast and Cervical Cancer Risk Assessment: Patient does not have family history of breast cancer, known genetic mutations, or radiation treatment to the chest before age 47. Patient does not have history of cervical dysplasia, immunocompromised, or DES exposure in-utero. Breast cancer risk assessment completed. No breast cancer risk calculated due to patient is less than 30 years old.  Risk Assessment   No risk assessment data for the current encounter  Risk Scores       10/15/2021   Last edited by: Rogerio Tempie SQUIBB, LPN   5-year risk:    Lifetime risk:            A: B[facility]CP exam with pap smear No complaints.  P: Referred patient to the [facility] for a diagnostic mammogram per recommendation. Appointment scheduled Thursday, May 03, 2024 at 1240.  Tara Wanda SQUIBB, RN 05/03/2024 11:04 AM

## 2024-05-03 NOTE — Patient Instructions (Signed)
 Explained breast self awareness with [name]. Patient is due for Pap smear. Patient is currently having menstrual period. Patient has rescheduled Pap smear for the free cervical cancer screening on Thursday, June 14, 2024 at 1830. Let her know [facility] will cover Pap smears every 3 years unless has a history of abnormal Pap smears. Referred patient to the [facility] for a diagnostic mammogram per recommendation. Appointment scheduled Thursday, May 03, 2024 at 1240. Patient aware of appointments and will be there. [name] verbalized understanding.  Karey Stucki, Wanda Ship, RN 11:04 AM

## 2024-06-14 ENCOUNTER — Ambulatory Visit: Payer: Self-pay
